# Patient Record
Sex: Male | Born: 1975 | Race: White | Hispanic: No | Marital: Married | Smoking: Never smoker
Health system: Southern US, Community
[De-identification: ages and names within clinical notes are randomized; demographics above are authoritative.]

## PROBLEM LIST (undated history)

## (undated) DIAGNOSIS — E785 Hyperlipidemia, unspecified: Secondary | ICD-10-CM

## (undated) DIAGNOSIS — K429 Umbilical hernia without obstruction or gangrene: Secondary | ICD-10-CM

## (undated) DIAGNOSIS — K219 Gastro-esophageal reflux disease without esophagitis: Secondary | ICD-10-CM

## (undated) DIAGNOSIS — F329 Major depressive disorder, single episode, unspecified: Secondary | ICD-10-CM

## (undated) DIAGNOSIS — F909 Attention-deficit hyperactivity disorder, unspecified type: Secondary | ICD-10-CM

## (undated) DIAGNOSIS — F32A Depression, unspecified: Secondary | ICD-10-CM

## (undated) DIAGNOSIS — J45909 Unspecified asthma, uncomplicated: Secondary | ICD-10-CM

## (undated) DIAGNOSIS — K4090 Unilateral inguinal hernia, without obstruction or gangrene, not specified as recurrent: Secondary | ICD-10-CM

## (undated) HISTORY — DX: Major depressive disorder, single episode, unspecified: F32.9

## (undated) HISTORY — DX: Attention-deficit hyperactivity disorder, unspecified type: F90.9

## (undated) HISTORY — DX: Depression, unspecified: F32.A

## (undated) HISTORY — DX: Hyperlipidemia, unspecified: E78.5

## (undated) HISTORY — DX: Unilateral inguinal hernia, without obstruction or gangrene, not specified as recurrent: K40.90

## (undated) HISTORY — PX: WISDOM TOOTH EXTRACTION: SHX21

## (undated) HISTORY — DX: Unspecified asthma, uncomplicated: J45.909

## (undated) HISTORY — DX: Gastro-esophageal reflux disease without esophagitis: K21.9

---

## 2001-10-24 ENCOUNTER — Ambulatory Visit (HOSPITAL_COMMUNITY): Admission: RE | Admit: 2001-10-24 | Discharge: 2001-10-24 | Payer: Self-pay | Admitting: Family Medicine

## 2001-10-24 ENCOUNTER — Encounter: Payer: Self-pay | Admitting: Family Medicine

## 2011-11-29 ENCOUNTER — Encounter (INDEPENDENT_AMBULATORY_CARE_PROVIDER_SITE_OTHER): Payer: Self-pay | Admitting: Surgery

## 2011-12-11 ENCOUNTER — Encounter (INDEPENDENT_AMBULATORY_CARE_PROVIDER_SITE_OTHER): Payer: Self-pay | Admitting: Surgery

## 2011-12-11 ENCOUNTER — Ambulatory Visit (INDEPENDENT_AMBULATORY_CARE_PROVIDER_SITE_OTHER): Payer: Managed Care, Other (non HMO) | Admitting: Surgery

## 2011-12-11 VITALS — BP 124/68 | HR 66 | Temp 97.9°F | Resp 14 | Ht 71.0 in | Wt 231.5 lb

## 2011-12-11 DIAGNOSIS — K429 Umbilical hernia without obstruction or gangrene: Secondary | ICD-10-CM

## 2011-12-11 DIAGNOSIS — K4020 Bilateral inguinal hernia, without obstruction or gangrene, not specified as recurrent: Secondary | ICD-10-CM | POA: Insufficient documentation

## 2011-12-11 NOTE — Progress Notes (Signed)
Patient ID: Steven Parker, male   DOB: 12-20-1975, 36 y.o.   MRN: [id number]  Chief Complaint  Patient presents with  . Inguinal Hernia    HPI Steven Parker is a 36 y.o. male.  This is a very pleasant gentleman referred by Dr. Nicholos Johns for evaluation of a possible inguinal hernia. The patient recently noticed a bulge in the left groin after having a lot of gas. His son had recently had a hernia repair himself so the patient was more aware of the issue. He has had no obstructive symptoms. The pain is only mild and not refer any where else. HPI  Past Medical History  Diagnosis Date  . GERD (gastroesophageal reflux disease)   . Asthma   . Depression   . Hyperlipidemia   . ADHD (attention deficit hyperactivity disorder)   . Inguinal hernia     Past Surgical History  Procedure Date  . Wisdom tooth extraction     Family History  Problem Relation Age of Onset  . Cancer Paternal Grandmother     pancreatic - 2010    Social History History  Substance Use Topics  . Smoking status: Never Smoker   . Smokeless tobacco: Never Used  . Alcohol Use: Yes     2 drinks per month    Allergies  Allergen Reactions  . Penicillins Shortness Of Breath and Swelling  . Potassium-Containing Compounds     Current Outpatient Prescriptions  Medication Sig Dispense Refill  . amphetamine-dextroamphetamine (ADDERALL XR) 30 MG 24 hr capsule Take 30 mg by mouth every morning.      Marland Kitchen esomeprazole (NEXIUM) 40 MG capsule Take 40 mg by mouth daily before breakfast.      . fish oil-omega-3 fatty acids 1000 MG capsule Take 2 g by mouth daily.      . Multiple Vitamins-Minerals (MULTIVITAMIN WITH MINERALS) tablet Take 1 tablet by mouth daily.        Review of Systems Review of Systems  Constitutional: Negative for fever, chills and unexpected weight change.  HENT: Negative for hearing loss, congestion, sore throat, trouble swallowing and voice change.   Eyes: Negative for visual disturbance.  Respiratory:  Negative for cough and wheezing.   Cardiovascular: Negative for chest pain, palpitations and leg swelling.  Gastrointestinal: Negative for nausea, vomiting, abdominal pain, diarrhea, constipation, blood in stool, abdominal distention, anal bleeding and rectal pain.  Genitourinary: Negative for hematuria and difficulty urinating.  Musculoskeletal: Negative for arthralgias.  Skin: Negative for rash and wound.  Neurological: Negative for seizures, syncope, weakness and headaches.  Hematological: Negative for adenopathy. Does not bruise/bleed easily.  Psychiatric/Behavioral: Negative for confusion.    Blood pressure 124/68, pulse 66, temperature 97.9 F (36.6 C), temperature source Temporal, resp. rate 14, height 5\' 11"  (1.803 m), weight 231 lb 8 oz (105.008 kg).  Physical Exam Physical Exam  Constitutional: He is oriented to person, place, and time. He appears well-developed and well-nourished. No distress.  HENT:  Head: Normocephalic and atraumatic.  Right Ear: External ear normal.  Left Ear: External ear normal.  Nose: Nose normal.  Mouth/Throat: Oropharynx is clear and moist. No oropharyngeal exudate.  Eyes: Conjunctivae and EOM are normal. Pupils are equal, round, and reactive to light. No scleral icterus.  Neck: Normal range of motion. Neck supple. No tracheal deviation present. No thyromegaly present.  Cardiovascular: Normal rate, regular rhythm, normal heart sounds and intact distal pulses.   No murmur heard. Pulmonary/Chest: Effort normal and breath sounds normal. No respiratory distress. He has no  wheezes.  Abdominal: Soft. Bowel sounds are normal. He exhibits no distension. There is no tenderness. There is no rebound.       he actually has bilateral inguinal hernias with the left being larger than the right. Both are reducible. He also has a small chronically incarcerated umbilical hernia  Musculoskeletal: Normal range of motion. He exhibits no edema and no tenderness.    Lymphadenopathy:    He has no cervical adenopathy.  Neurological: He is alert and oriented to person, place, and time.  Skin: Skin is warm and dry. No rash noted. He is not diaphoretic. No erythema.  Psychiatric: His behavior is normal. Judgment normal.    Data Reviewed   Assessment    Bilateral inguinal hernias and an umbilical hernia    Plan    Laparoscopic repair with mesh was recommended. I discussed this with the patient in detail. I discussed the risk of surgery which includes but is not limited to bleeding, infection, recurrence, nerve entrapment, chronic pain, postoperative recovery, et [name]. He understands and wishes to proceed. Likelihood of success is good       [name] A 12/11/2011, 10:23 AM

## 2012-01-07 ENCOUNTER — Encounter (HOSPITAL_COMMUNITY): Payer: Self-pay | Admitting: Pharmacy Technician

## 2012-01-14 ENCOUNTER — Encounter (HOSPITAL_COMMUNITY): Payer: Self-pay

## 2012-01-14 ENCOUNTER — Encounter (HOSPITAL_COMMUNITY)
Admission: RE | Admit: 2012-01-14 | Discharge: 2012-01-14 | Disposition: A | Discharge status: Home / Self Care | Payer: Managed Care, Other (non HMO) | Source: Ambulatory Visit | Attending: Surgery | Admitting: Surgery

## 2012-01-14 DIAGNOSIS — K429 Umbilical hernia without obstruction or gangrene: Secondary | ICD-10-CM

## 2012-01-14 DIAGNOSIS — J45909 Unspecified asthma, uncomplicated: Secondary | ICD-10-CM

## 2012-01-14 DIAGNOSIS — K219 Gastro-esophageal reflux disease without esophagitis: Secondary | ICD-10-CM

## 2012-01-14 HISTORY — DX: Umbilical hernia without obstruction or gangrene: K42.9

## 2012-01-14 HISTORY — DX: Unspecified asthma, uncomplicated: J45.909

## 2012-01-14 HISTORY — DX: Gastro-esophageal reflux disease without esophagitis: K21.9

## 2012-01-14 LAB — SURGICAL PCR SCREEN: Staphylococcus aureus: POSITIVE — AB

## 2012-01-14 NOTE — Pre-Procedure Instructions (Signed)
01-14-12 Pt. Made aware of Positive PCR screen(Staph aureus). Pt will fill Rx. For Mupirocin and use as directed. W. Kennon Portela

## 2012-01-14 NOTE — Patient Instructions (Addendum)
20 Quade Ramirez  01/14/2012   Your procedure is scheduled on:   01-17-2012  Report to [facility] at        0900 AM.  Call this number if you have problems the morning of surgery: [phone]/ [phone] Presurgical Testing   Remember:   Do not eat food:After Midnight.  May have clear liquids:up to 6 Hours before arrival. Nothing after : 0500 Am  Clear liquids include soda, tea, black coffee, apple or grape juice, broth.  Take these medicines the morning of surgery with A SIP OF WATER: none   Do not wear jewelry, make-up or nail polish.  Do not wear lotions, powders, or perfumes. You may wear deodorant.  Do not shave 48 hours prior to surgery.(face and neck okay, no shaving of legs)  Do not bring valuables to the hospital.  Contacts, dentures or bridgework may not be worn into surgery.  Leave suitcase in the car. After surgery it may be brought to your room.  For patients admitted to the hospital, checkout time is 11:00 AM the day of discharge.   Patients discharged the day of surgery will not be allowed to drive home.  Name and phone number of your driver: spouse  Special Instructions: CHG Shower Use Special Wash: 1/2 bottle night before surgery and 1/2 bottle morning of surgery.(avoid face and genitals)   Please read over the following fact sheets that you were given: MRSA Information, Incentive Spirometry Instruction.

## 2012-01-16 NOTE — H&P (Signed)
Patient ID: Steven Parker, male DOB: 02-21-1976, 36 y.o. MRN: [id number]  Chief Complaint   Patient presents with   .  Inguinal Hernia    HPI  Steven Parker is a 36 y.o. male. This is a very pleasant gentleman referred by Dr. Nicholos Johns for evaluation of a possible inguinal hernia. The patient recently noticed a bulge in the left groin after having a lot of gas. His son had recently had a hernia repair himself so the patient was more aware of the issue. He has had no obstructive symptoms. The pain is only mild and not refer any where else.  HPI  Past Medical History   Diagnosis  Date   .  GERD (gastroesophageal reflux disease)    .  Asthma    .  Depression    .  Hyperlipidemia    .  ADHD (attention deficit hyperactivity disorder)    .  Inguinal hernia     Past Surgical History   Procedure  Date   .  Wisdom tooth extraction     Family History   Problem  Relation  Age of Onset   .  Cancer  Paternal Grandmother       pancreatic - 2010    Social History  History   Substance Use Topics   .  Smoking status:  Never Smoker   .  Smokeless tobacco:  Never Used   .  Alcohol Use:  Yes      2 drinks per month    Allergies   Allergen  Reactions   .  Penicillins  Shortness Of Breath and Swelling   .  Potassium-Containing Compounds     Current Outpatient Prescriptions   Medication  Sig  Dispense  Refill   .  amphetamine-dextroamphetamine (ADDERALL XR) 30 MG 24 hr capsule  Take 30 mg by mouth every morning.     Marland Kitchen  esomeprazole (NEXIUM) 40 MG capsule  Take 40 mg by mouth daily before breakfast.     .  fish oil-omega-3 fatty acids 1000 MG capsule  Take 2 g by mouth daily.     .  Multiple Vitamins-Minerals (MULTIVITAMIN WITH MINERALS) tablet  Take 1 tablet by mouth daily.      Review of Systems  Review of Systems  Constitutional: Negative for fever, chills and unexpected weight change.  HENT: Negative for hearing loss, congestion, sore throat, trouble swallowing and voice change.  Eyes:  Negative for visual disturbance.  Respiratory: Negative for cough and wheezing.  Cardiovascular: Negative for chest pain, palpitations and leg swelling.  Gastrointestinal: Negative for nausea, vomiting, abdominal pain, diarrhea, constipation, blood in stool, abdominal distention, anal bleeding and rectal pain.  Genitourinary: Negative for hematuria and difficulty urinating.  Musculoskeletal: Negative for arthralgias.  Skin: Negative for rash and wound.  Neurological: Negative for seizures, syncope, weakness and headaches.  Hematological: Negative for adenopathy. Does not bruise/bleed easily.  Psychiatric/Behavioral: Negative for confusion.   Blood pressure 124/68, pulse 66, temperature 97.9 F (36.6 C), temperature source Temporal, resp. rate 14, height 5\' 11"  (1.803 m), weight 231 lb 8 oz (105.008 kg).  Physical Exam  Physical Exam  Constitutional: He is oriented to person, place, and time. He appears well-developed and well-nourished. No distress.  HENT:  Head: Normocephalic and atraumatic.  Right Ear: External ear normal.  Left Ear: External ear normal.  Nose: Nose normal.  Mouth/Throat: Oropharynx is clear and moist. No oropharyngeal exudate.  Eyes: Conjunctivae and EOM are normal. Pupils are equal, round, and  reactive to light. No scleral icterus.  Neck: Normal range of motion. Neck supple. No tracheal deviation present. No thyromegaly present.  Cardiovascular: Normal rate, regular rhythm, normal heart sounds and intact distal pulses.  No murmur heard.  Pulmonary/Chest: Effort normal and breath sounds normal. No respiratory distress. He has no wheezes.  Abdominal: Soft. Bowel sounds are normal. He exhibits no distension. There is no tenderness. There is no rebound.  he actually has bilateral inguinal hernias with the left being larger than the right. Both are reducible. He also has a small chronically incarcerated umbilical hernia  Musculoskeletal: Normal range of motion. He  exhibits no edema and no tenderness.  Lymphadenopathy:  He has no cervical adenopathy.  Neurological: He is alert and oriented to person, place, and time.  Skin: Skin is warm and dry. No rash noted. He is not diaphoretic. No erythema.  Psychiatric: His behavior is normal. Judgment normal.   Data Reviewed  Assessment   Bilateral inguinal hernias and an umbilical hernia   Plan   Laparoscopic repair with mesh was recommended. I discussed this with the patient in detail. I discussed the risk of surgery which includes but is not limited to bleeding, infection, recurrence, nerve entrapment, chronic pain, postoperative recovery, et [name]. He understands and wishes to proceed. Likelihood of success is good   Loudon Krakow A

## 2012-01-17 ENCOUNTER — Ambulatory Visit (HOSPITAL_COMMUNITY): Payer: Managed Care, Other (non HMO) | Admitting: Certified Registered Nurse Anesthetist

## 2012-01-17 ENCOUNTER — Encounter (HOSPITAL_COMMUNITY): Payer: Self-pay

## 2012-01-17 ENCOUNTER — Ambulatory Visit (HOSPITAL_COMMUNITY)
Admission: RE | Admit: 2012-01-17 | Discharge: 2012-01-17 | Disposition: A | Discharge status: Home / Self Care | Payer: Managed Care, Other (non HMO) | Source: Ambulatory Visit | Attending: Surgery | Admitting: Surgery

## 2012-01-17 ENCOUNTER — Encounter (HOSPITAL_COMMUNITY): Payer: Self-pay | Admitting: Certified Registered Nurse Anesthetist

## 2012-01-17 ENCOUNTER — Encounter (HOSPITAL_COMMUNITY)
Admission: RE | Disposition: A | Discharge status: Home / Self Care | Payer: Self-pay | Source: Ambulatory Visit | Attending: Surgery

## 2012-01-17 DIAGNOSIS — Z79899 Other long term (current) drug therapy: Secondary | ICD-10-CM | POA: Insufficient documentation

## 2012-01-17 DIAGNOSIS — K4020 Bilateral inguinal hernia, without obstruction or gangrene, not specified as recurrent: Secondary | ICD-10-CM

## 2012-01-17 DIAGNOSIS — E785 Hyperlipidemia, unspecified: Secondary | ICD-10-CM | POA: Insufficient documentation

## 2012-01-17 DIAGNOSIS — J45909 Unspecified asthma, uncomplicated: Secondary | ICD-10-CM | POA: Insufficient documentation

## 2012-01-17 DIAGNOSIS — K429 Umbilical hernia without obstruction or gangrene: Secondary | ICD-10-CM | POA: Insufficient documentation

## 2012-01-17 DIAGNOSIS — K219 Gastro-esophageal reflux disease without esophagitis: Secondary | ICD-10-CM | POA: Insufficient documentation

## 2012-01-17 DIAGNOSIS — Z01812 Encounter for preprocedural laboratory examination: Secondary | ICD-10-CM | POA: Insufficient documentation

## 2012-01-17 HISTORY — PX: INGUINAL HERNIA REPAIR: SHX194

## 2012-01-17 HISTORY — PX: UMBILICAL HERNIA REPAIR: SHX196

## 2012-01-17 SURGERY — REPAIR, HERNIA, INGUINAL, LAPAROSCOPIC
Anesthesia: General | Site: Abdomen

## 2012-01-17 MED ORDER — ACETAMINOPHEN 10 MG/ML IV SOLN
INTRAVENOUS | Status: AC
  Filled 2012-01-17: qty 100

## 2012-01-17 MED ORDER — SODIUM CHLORIDE 0.9 % IJ SOLN: 3.0000 mL | Freq: Two times a day (BID) | INTRAMUSCULAR | Status: DC

## 2012-01-17 MED ORDER — MORPHINE SULFATE 10 MG/ML IJ SOLN: 4.0000 mg | INTRAMUSCULAR | Status: DC | PRN

## 2012-01-17 MED ORDER — FENTANYL CITRATE 0.05 MG/ML IJ SOLN
INTRAMUSCULAR | Status: DC | PRN
  Administered 2012-01-17 (×2): 50 ug via INTRAVENOUS

## 2012-01-17 MED ORDER — OXYCODONE HCL 5 MG PO TABS
5.0000 mg | ORAL_TABLET | ORAL | Status: DC | PRN
  Administered 2012-01-17 (×3): 5 mg via ORAL
  Filled 2012-01-17 (×3): qty 1

## 2012-01-17 MED ORDER — GLYCOPYRROLATE 0.2 MG/ML IJ SOLN
INTRAMUSCULAR | Status: DC | PRN
  Administered 2012-01-17: 0.6 mg via INTRAVENOUS

## 2012-01-17 MED ORDER — HYDROMORPHONE HCL PF 1 MG/ML IJ SOLN
0.2500 mg | INTRAMUSCULAR | Status: DC | PRN
  Administered 2012-01-17: 0.5 mg via INTRAVENOUS

## 2012-01-17 MED ORDER — CIPROFLOXACIN IN D5W 400 MG/200ML IV SOLN
400.0000 mg | INTRAVENOUS | Status: AC
  Administered 2012-01-17: 400 mg via INTRAVENOUS

## 2012-01-17 MED ORDER — 0.9 % SODIUM CHLORIDE (POUR BTL) OPTIME
TOPICAL | Status: DC | PRN
  Administered 2012-01-17: 1000 mL

## 2012-01-17 MED ORDER — HYDROCODONE-ACETAMINOPHEN 5-325 MG PO TABS: 1.0000 | ORAL_TABLET | ORAL | Status: AC | PRN

## 2012-01-17 MED ORDER — ACETAMINOPHEN 325 MG PO TABS: 650.0000 mg | ORAL_TABLET | ORAL | Status: DC | PRN

## 2012-01-17 MED ORDER — SODIUM CHLORIDE 0.9 % IV SOLN: 250.0000 mL | INTRAVENOUS | Status: DC | PRN

## 2012-01-17 MED ORDER — ROCURONIUM BROMIDE 100 MG/10ML IV SOLN
INTRAVENOUS | Status: DC | PRN
  Administered 2012-01-17: 10 mg via INTRAVENOUS
  Administered 2012-01-17: 40 mg via INTRAVENOUS

## 2012-01-17 MED ORDER — KETOROLAC TROMETHAMINE 30 MG/ML IJ SOLN
INTRAMUSCULAR | Status: DC | PRN
  Administered 2012-01-17: 30 mg via INTRAVENOUS

## 2012-01-17 MED ORDER — LACTATED RINGERS IV SOLN
INTRAVENOUS | Status: DC
  Administered 2012-01-17: 10:00:00 via INTRAVENOUS

## 2012-01-17 MED ORDER — PROPOFOL 10 MG/ML IV EMUL
INTRAVENOUS | Status: DC | PRN
  Administered 2012-01-17: 150 mg via INTRAVENOUS

## 2012-01-17 MED ORDER — EPHEDRINE SULFATE 50 MG/ML IJ SOLN
INTRAMUSCULAR | Status: DC | PRN
  Administered 2012-01-17: 10 mg via INTRAVENOUS

## 2012-01-17 MED ORDER — ACETAMINOPHEN 10 MG/ML IV SOLN
INTRAVENOUS | Status: DC | PRN
  Administered 2012-01-17: 1000 mg via INTRAVENOUS

## 2012-01-17 MED ORDER — LACTATED RINGERS IV SOLN: INTRAVENOUS | Status: DC

## 2012-01-17 MED ORDER — ONDANSETRON HCL 4 MG/2ML IJ SOLN: 4.0000 mg | Freq: Four times a day (QID) | INTRAMUSCULAR | Status: DC | PRN

## 2012-01-17 MED ORDER — PROMETHAZINE HCL 25 MG/ML IJ SOLN: 6.2500 mg | INTRAMUSCULAR | Status: DC | PRN

## 2012-01-17 MED ORDER — BUPIVACAINE HCL (PF) 0.5 % IJ SOLN
INTRAMUSCULAR | Status: AC
  Filled 2012-01-17: qty 30

## 2012-01-17 MED ORDER — ONDANSETRON HCL 4 MG/2ML IJ SOLN
INTRAMUSCULAR | Status: DC | PRN
  Administered 2012-01-17: 4 mg via INTRAVENOUS

## 2012-01-17 MED ORDER — BUPIVACAINE HCL (PF) 0.5 % IJ SOLN
INTRAMUSCULAR | Status: DC | PRN
  Administered 2012-01-17: 20 mL

## 2012-01-17 MED ORDER — NEOSTIGMINE METHYLSULFATE 1 MG/ML IJ SOLN
INTRAMUSCULAR | Status: DC | PRN
  Administered 2012-01-17: 5 mg via INTRAVENOUS

## 2012-01-17 MED ORDER — HYDROMORPHONE HCL PF 1 MG/ML IJ SOLN
INTRAMUSCULAR | Status: AC
  Filled 2012-01-17: qty 1

## 2012-01-17 MED ORDER — MIDAZOLAM HCL 5 MG/5ML IJ SOLN
INTRAMUSCULAR | Status: DC | PRN
  Administered 2012-01-17: 2 mg via INTRAVENOUS

## 2012-01-17 MED ORDER — CIPROFLOXACIN IN D5W 400 MG/200ML IV SOLN
INTRAVENOUS | Status: AC
Start: 2012-01-17 — End: 2012-01-17
  Filled 2012-01-17: qty 200

## 2012-01-17 MED ORDER — ACETAMINOPHEN 650 MG RE SUPP
650.0000 mg | RECTAL | Status: DC | PRN
  Filled 2012-01-17: qty 1

## 2012-01-17 MED ORDER — SODIUM CHLORIDE 0.9 % IV SOLN: INTRAVENOUS | Status: DC

## 2012-01-17 MED ORDER — MEPERIDINE HCL 50 MG/ML IJ SOLN: 6.2500 mg | INTRAMUSCULAR | Status: DC | PRN

## 2012-01-17 MED ORDER — SODIUM CHLORIDE 0.9 % IJ SOLN: 3.0000 mL | INTRAMUSCULAR | Status: DC | PRN

## 2012-01-17 SURGICAL SUPPLY — 55 items
ADH SKN CLS APL DERMABOND .7 (GAUZE/BANDAGES/DRESSINGS) ×3
APL SKNCLS STERI-STRIP NONHPOA (GAUZE/BANDAGES/DRESSINGS) ×3
BANDAGE ADHESIVE 1X3 (GAUZE/BANDAGES/DRESSINGS) IMPLANT
BENZOIN TINCTURE PRP APPL 2/3 (GAUZE/BANDAGES/DRESSINGS) ×4 IMPLANT
BLADE SURG SZ11 CARB STEEL (BLADE) ×4 IMPLANT
CABLE HIGH FREQUENCY MONO STRZ (ELECTRODE) ×4 IMPLANT
CANISTER SUCTION 2500CC (MISCELLANEOUS) ×4 IMPLANT
CLOTH BEACON ORANGE TIMEOUT ST (SAFETY) ×4 IMPLANT
CLSR STERI-STRIP ANTIMIC 1/2X4 (GAUZE/BANDAGES/DRESSINGS) ×2 IMPLANT
DECANTER SPIKE VIAL GLASS SM (MISCELLANEOUS) ×4 IMPLANT
DERMABOND ADVANCED (GAUZE/BANDAGES/DRESSINGS) ×1
DERMABOND ADVANCED .7 DNX12 (GAUZE/BANDAGES/DRESSINGS) ×3 IMPLANT
DEVICE SECURE STRAP 25 ABSORB (INSTRUMENTS) ×4 IMPLANT
DISSECT BALLN SPACEMKR + OVL (BALLOONS) ×4
DISSECTOR BALLN SPACEMKR + OVL (BALLOONS) ×3 IMPLANT
DISSECTOR BLUNT TIP ENDO 5MM (MISCELLANEOUS) IMPLANT
DRAPE INCISE IOBAN 66X45 STRL (DRAPES) ×4 IMPLANT
DRAPE LAPAROSCOPIC ABDOMINAL (DRAPES) ×4 IMPLANT
ELECT REM PT RETURN 9FT ADLT (ELECTROSURGICAL) ×4
ELECTRODE REM PT RTRN 9FT ADLT (ELECTROSURGICAL) ×3 IMPLANT
GLOVE BIO SURGEON STRL SZ7 (GLOVE) ×8 IMPLANT
GLOVE BIOGEL PI IND STRL 7.0 (GLOVE) ×3 IMPLANT
GLOVE BIOGEL PI IND STRL 7.5 (GLOVE) ×6 IMPLANT
GLOVE BIOGEL PI INDICATOR 7.0 (GLOVE) ×1
GLOVE BIOGEL PI INDICATOR 7.5 (GLOVE) ×2
GLOVE ECLIPSE 8.0 STRL XLNG CF (GLOVE) ×4 IMPLANT
GLOVE SURG SIGNA 7.5 PF LTX (GLOVE) ×8 IMPLANT
GOWN PREVENTION PLUS LG XLONG (DISPOSABLE) ×4 IMPLANT
GOWN PREVENTION PLUS XLARGE (GOWN DISPOSABLE) ×4 IMPLANT
GOWN STRL NON-REIN LRG LVL3 (GOWN DISPOSABLE) ×4 IMPLANT
GOWN STRL REIN XL XLG (GOWN DISPOSABLE) ×8 IMPLANT
KIT BASIN OR (CUSTOM PROCEDURE TRAY) ×4 IMPLANT
MESH 3DMAX 4X6 LT LRG (Mesh General) ×2 IMPLANT
MESH 3DMAX 4X6 RT LRG (Mesh General) ×2 IMPLANT
NDL INSUFFLATION 14GA 120MM (NEEDLE) IMPLANT
NEEDLE INSUFFLATION 14GA 120MM (NEEDLE) IMPLANT
NS IRRIG 1000ML POUR BTL (IV SOLUTION) ×4 IMPLANT
PEN SKIN MARKING BROAD (MISCELLANEOUS) ×4 IMPLANT
SCISSORS LAP 5X35 DISP (ENDOMECHANICALS) ×4 IMPLANT
SET IRRIG TUBING LAPAROSCOPIC (IRRIGATION / IRRIGATOR) IMPLANT
SOLUTION ANTI FOG 6CC (MISCELLANEOUS) ×4 IMPLANT
STAPLER VISISTAT 35W (STAPLE) ×4 IMPLANT
STRIP CLOSURE SKIN 1/2X4 (GAUZE/BANDAGES/DRESSINGS) ×4 IMPLANT
SUT MNCRL AB 4-0 PS2 18 (SUTURE) ×4 IMPLANT
SUT PROLENE 0 CT 1 CR/8 (SUTURE) IMPLANT
SUT PROLENE 2 0 SH DA (SUTURE) IMPLANT
TACKER 5MM HERNIA 3.5CML NAB (ENDOMECHANICALS) ×2 IMPLANT
TOWEL OR 17X26 10 PK STRL BLUE (TOWEL DISPOSABLE) ×4 IMPLANT
TRAY FOLEY CATH 14FRSI W/METER (CATHETERS) ×4 IMPLANT
TRAY LAP CHOLE (CUSTOM PROCEDURE TRAY) ×4 IMPLANT
TROCAR BALLN 12MMX100 BLUNT (TROCAR) ×2 IMPLANT
TROCAR BLADELESS OPT 5 75 (ENDOMECHANICALS) ×4 IMPLANT
TROCAR CANNULA W/PORT DUAL 5MM (MISCELLANEOUS) ×4 IMPLANT
TROCAR XCEL NON-BLD 11X100MML (ENDOMECHANICALS) ×2 IMPLANT
TUBING INSUFFLATION 10FT LAP (TUBING) ×4 IMPLANT

## 2012-01-17 NOTE — Transfer of Care (Signed)
Immediate Anesthesia Transfer of Care Note  Patient: Steven Parker  Procedure(s) Performed: Procedure(s) (LRB): LAPAROSCOPIC INGUINAL HERNIA (Bilateral) INSERTION OF MESH (N/A) HERNIA REPAIR UMBILICAL ADULT (N/A)  Patient Location: PACU  Anesthesia Type: General  Level of Consciousness: sedated, patient cooperative and responds to stimulaton  Airway & Oxygen Therapy: Patient Spontanous Breathing and Patient connected to face mask oxgen  Post-op Assessment: Report given to PACU RN and Post -op Vital signs reviewed and stable  Post vital signs: Reviewed and stable  Complications: No apparent anesthesia complications

## 2012-01-17 NOTE — Op Note (Signed)
LAPAROSCOPIC INGUINAL HERNIA, INSERTION OF MESH, HERNIA REPAIR UMBILICAL ADULT  Procedure Note  Steven Parker 01/17/2012   Pre-op Diagnosis: bilateral inguinal hernia, umbilical hernia     Post-op Diagnosis: same  Procedure(s): LAPAROSCOPIC INGUINAL HERNIA INSERTION OF MESH HERNIA REPAIR UMBILICAL ADULT  Surgeon(s): Shelly Rubenstein, MD  Anesthesia: General  Staff:  Gerda Diss, RN - Circulator Guadelupe Sabin, CST - Scrub Person April C McKinney, CST - Scrub Person  Estimated Blood Loss: Minimal               Procedure: The patient was brought to the operating room and identified as the correct patient. He was placed supine on the operating room table and general anesthesia was induced. His abdomen was then prepped and draped in the usual sterile fashion. I made Parker small incision just below the umbilicus. I did this down to the fascia which was opened with Parker scalpel. The rectus muscle was identified and elevated. I put Parker dissecting balloon down underneath the rectus muscle and manipulated to the pubis. I then insufflated Parker dissecting balloon under direct vision dissecting out the preperitoneal space.  I then removed the dissecting balloon and insufflated the space with CO2. I then placed 25 mm ports in the patient's lower midline under direct vision. First dissect out the right inguinal area. The patient had Parker large indirect hernia sac which as it was reduced from the cord structures. I easily identify Cooper's ligament. There was no evidence of direct hernia. I then turned my attention to the left inguinal area. Again the patient had Parker large indirect hernia sac the left side. I was able to reduce this from the cord structures as well. There was also no sign of Parker direct hernia on the right or left. I then brought to separate pieces of Bard 3-D mesh onto the field. I placed Parker left mesh to the trocar and opened up as it only of left 4. Then using absorbable tacker to tack it to the  Cooper's ligament the medial abdominal wall and out slightly laterally. I then brought the right-sided piece of mesh onto the field and placed into the port at the umbilicus and again opened as it only on the right inguinal floor. I again used the absorbable tacker to tack it to the medial abdominal wall Cooper's ligament and slightly laterally. Good coverage of both inguinal floors appeared be achieved. Hemostasis also achieved. All ports were removed under direct vision and the preperitoneal space appeared to collapse appropriately leaving the mesh in place. I then removed the port at the umbilicus.  I then excised Parker small umbilical hernia sac. I closed the very tiny fascial defect with Parker figure-of-eight 0 Vicryl suture. I then closed the fascial incision with 0 Vicryl suture as well. I anesthetized all the incisions with Marcaine. I then performed an ilioinguinal nerve block bilaterally Marcaine as well. I then closed all incisions with 4-0 Monocryl subcuticular sutures. Steri-Strips and Band-Aids were applied. The patient tolerated the procedure well. All the counts were correct at the end of the procedure. The patient was then extubated in the operating room and taken in stable condition to the recovery room.          Steven Parker   Date: 01/17/2012  Time: 11:45 AM

## 2012-01-17 NOTE — Progress Notes (Signed)
Up to BR with help and walked in hall. Unable to void  Back to bed

## 2012-01-17 NOTE — Anesthesia Preprocedure Evaluation (Signed)

## 2012-01-17 NOTE — Interval H&P Note (Signed)
History and Physical Interval Note:  01/17/2012 9:17 AM  Steven Parker  has presented today for surgery, with the diagnosis of bilateral inguinal hernia, umbilical hernia  The various methods of treatment have been discussed with the patient and family. After consideration of risks, benefits and other options for treatment, the patient has consented to  Procedure(s) (LRB): LAPAROSCOPIC INGUINAL HERNIA (Bilateral) LAPAROSCOPIC UMBILICAL HERNIA (N/A) INSERTION OF MESH (N/A) as a surgical intervention .  The patient's history has been reviewed, patient examined, no change in status, stable for surgery.  I have reviewed the patient's chart and labs.  Questions were answered to the patient's satisfaction.     Tinsley Lomas A

## 2012-01-17 NOTE — Anesthesia Postprocedure Evaluation (Signed)
  Anesthesia Post-op Note  Patient: Steven Parker  Procedure(s) Performed: Procedure(s) (LRB): LAPAROSCOPIC INGUINAL HERNIA (Bilateral) INSERTION OF MESH (N/A) HERNIA REPAIR UMBILICAL ADULT (N/A)  Patient Location: PACU  Anesthesia Type: General  Level of Consciousness: awake and alert   Airway and Oxygen Therapy: Patient Spontanous Breathing  Post-op Pain: mild  Post-op Assessment: Post-op Vital signs reviewed, Patient's Cardiovascular Status Stable, Respiratory Function Stable, Patent Airway and No signs of Nausea or vomiting  Post-op Vital Signs: stable  Complications: No apparent anesthesia complications

## 2012-01-18 ENCOUNTER — Encounter (HOSPITAL_COMMUNITY): Payer: Self-pay | Admitting: Surgery

## 2012-02-12 ENCOUNTER — Encounter (INDEPENDENT_AMBULATORY_CARE_PROVIDER_SITE_OTHER): Payer: Managed Care, Other (non HMO) | Admitting: Surgery

## 2012-02-15 ENCOUNTER — Encounter (INDEPENDENT_AMBULATORY_CARE_PROVIDER_SITE_OTHER): Payer: Managed Care, Other (non HMO) | Admitting: Surgery

## 2012-02-18 ENCOUNTER — Telehealth (INDEPENDENT_AMBULATORY_CARE_PROVIDER_SITE_OTHER): Payer: Self-pay | Admitting: General Surgery

## 2012-02-18 NOTE — Telephone Encounter (Signed)
Pt called to report he is leaving for extended stay in [place] (5-6 months) and had question about his incision.  He will not be available to come in for a post op check, as he is leaving day after tomorrow.  Pt denies pain, fever or any problems with bowel movements.  Instructed pt that sutures are dissolvable, so do not need to be removed in the office.

## 2015-12-06 DIAGNOSIS — J069 Acute upper respiratory infection, unspecified: Secondary | ICD-10-CM | POA: Diagnosis not present

## 2016-01-03 DIAGNOSIS — R05 Cough: Secondary | ICD-10-CM | POA: Diagnosis not present

## 2016-01-03 DIAGNOSIS — J189 Pneumonia, unspecified organism: Secondary | ICD-10-CM | POA: Diagnosis not present

## 2016-01-28 DIAGNOSIS — R05 Cough: Secondary | ICD-10-CM | POA: Diagnosis not present

## 2016-01-28 DIAGNOSIS — J029 Acute pharyngitis, unspecified: Secondary | ICD-10-CM | POA: Diagnosis not present

## 2016-08-21 DIAGNOSIS — Z6837 Body mass index (BMI) 37.0-37.9, adult: Secondary | ICD-10-CM | POA: Diagnosis not present

## 2016-08-21 DIAGNOSIS — Z13220 Encounter for screening for lipoid disorders: Secondary | ICD-10-CM | POA: Diagnosis not present

## 2016-08-21 DIAGNOSIS — Z713 Dietary counseling and surveillance: Secondary | ICD-10-CM | POA: Diagnosis not present

## 2016-08-21 DIAGNOSIS — Z136 Encounter for screening for cardiovascular disorders: Secondary | ICD-10-CM | POA: Diagnosis not present

## 2016-12-30 DIAGNOSIS — J069 Acute upper respiratory infection, unspecified: Secondary | ICD-10-CM | POA: Diagnosis not present

## 2016-12-30 DIAGNOSIS — R05 Cough: Secondary | ICD-10-CM | POA: Diagnosis not present

## 2017-01-26 DIAGNOSIS — R1033 Periumbilical pain: Secondary | ICD-10-CM | POA: Diagnosis not present

## 2017-01-29 ENCOUNTER — Other Ambulatory Visit: Payer: Self-pay | Admitting: Family Medicine

## 2017-01-29 DIAGNOSIS — R1033 Periumbilical pain: Secondary | ICD-10-CM

## 2017-02-01 ENCOUNTER — Ambulatory Visit
Admission: RE | Admit: 2017-02-01 | Discharge: 2017-02-01 | Disposition: A | Discharge status: Home / Self Care | Payer: BLUE CROSS/BLUE SHIELD | Source: Ambulatory Visit | Attending: Family Medicine | Admitting: Family Medicine

## 2017-02-01 DIAGNOSIS — R1033 Periumbilical pain: Secondary | ICD-10-CM

## 2017-06-27 DIAGNOSIS — N529 Male erectile dysfunction, unspecified: Secondary | ICD-10-CM | POA: Diagnosis not present

## 2017-06-27 DIAGNOSIS — K219 Gastro-esophageal reflux disease without esophagitis: Secondary | ICD-10-CM | POA: Diagnosis not present

## 2017-07-30 DIAGNOSIS — M25561 Pain in right knee: Secondary | ICD-10-CM | POA: Diagnosis not present

## 2017-07-30 DIAGNOSIS — M7052 Other bursitis of knee, left knee: Secondary | ICD-10-CM | POA: Diagnosis not present

## 2017-08-23 DIAGNOSIS — Z713 Dietary counseling and surveillance: Secondary | ICD-10-CM | POA: Diagnosis not present

## 2017-08-23 DIAGNOSIS — Z136 Encounter for screening for cardiovascular disorders: Secondary | ICD-10-CM | POA: Diagnosis not present

## 2017-08-23 DIAGNOSIS — Z13220 Encounter for screening for lipoid disorders: Secondary | ICD-10-CM | POA: Diagnosis not present

## 2017-08-23 DIAGNOSIS — Z23 Encounter for immunization: Secondary | ICD-10-CM | POA: Diagnosis not present

## 2017-10-31 DIAGNOSIS — F4322 Adjustment disorder with anxiety: Secondary | ICD-10-CM | POA: Diagnosis not present

## 2017-11-21 DIAGNOSIS — F4322 Adjustment disorder with anxiety: Secondary | ICD-10-CM | POA: Diagnosis not present

## 2017-12-05 DIAGNOSIS — F4322 Adjustment disorder with anxiety: Secondary | ICD-10-CM | POA: Diagnosis not present

## 2018-01-16 DIAGNOSIS — F4322 Adjustment disorder with anxiety: Secondary | ICD-10-CM | POA: Diagnosis not present

## 2018-01-30 DIAGNOSIS — F4322 Adjustment disorder with anxiety: Secondary | ICD-10-CM | POA: Diagnosis not present

## 2018-02-13 DIAGNOSIS — F4322 Adjustment disorder with anxiety: Secondary | ICD-10-CM | POA: Diagnosis not present

## 2018-02-27 DIAGNOSIS — F4322 Adjustment disorder with anxiety: Secondary | ICD-10-CM | POA: Diagnosis not present

## 2018-04-03 DIAGNOSIS — F4322 Adjustment disorder with anxiety: Secondary | ICD-10-CM | POA: Diagnosis not present

## 2018-05-01 DIAGNOSIS — F4322 Adjustment disorder with anxiety: Secondary | ICD-10-CM | POA: Diagnosis not present

## 2018-06-10 IMAGING — US US ABDOMEN LIMITED
1 series · 12 of 12 positions shown · non-contrast
Comparison: None.

CLINICAL DATA: History of umbilical hernia repair 6 years ago.
Patient presents with 2 weeks of right lower periumbilical pain.

EXAM:
ULTRASOUND ABDOMEN LIMITED

[Series 1: us abdomen limited · 0.08mm/px · 12 acquisitions, 12 frames shown]
[im 1/12]
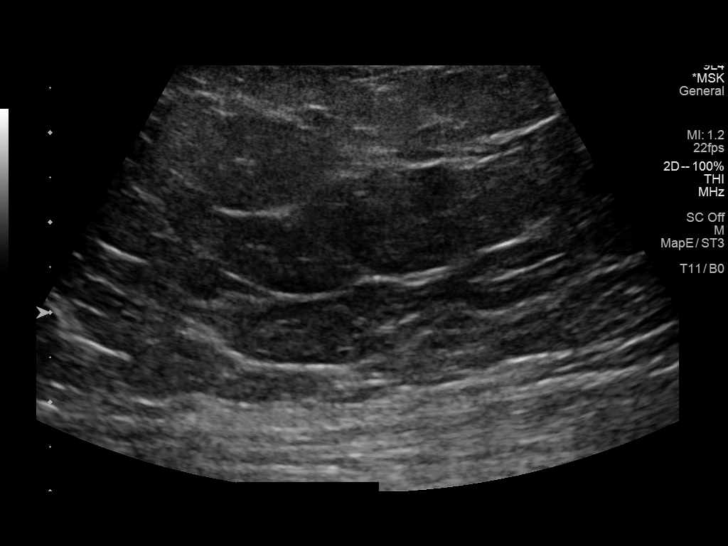
[im 2/12]
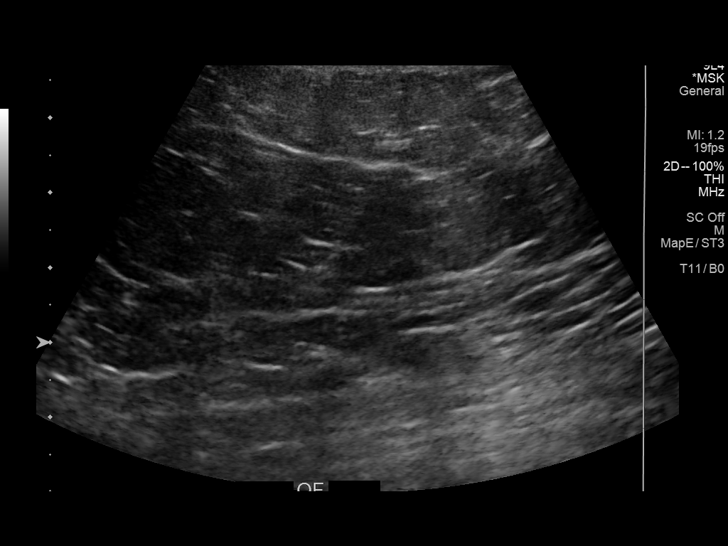
[im 3/12]
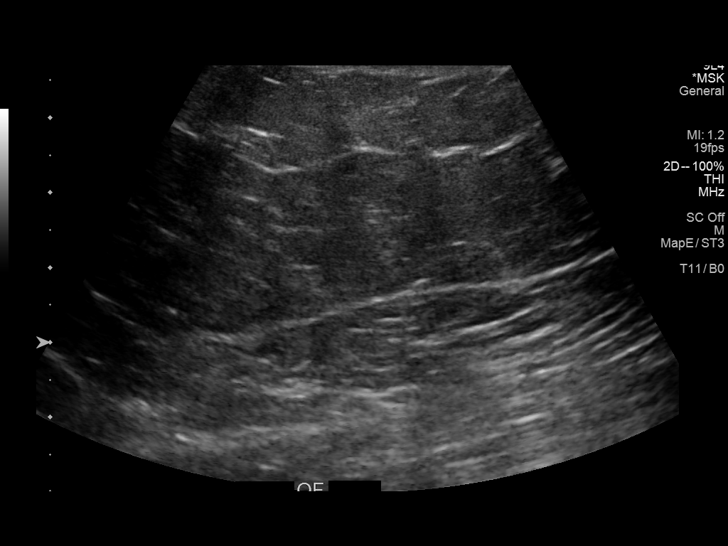
[im 4/12]
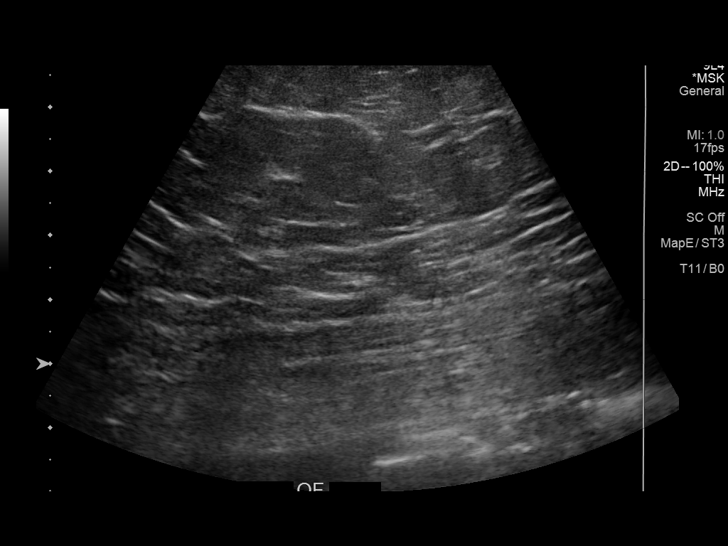
[im 5/12]
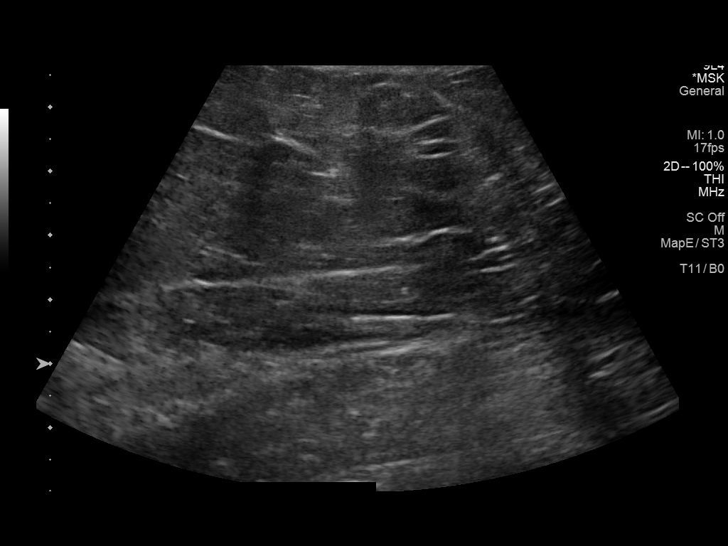
[im 6/12]
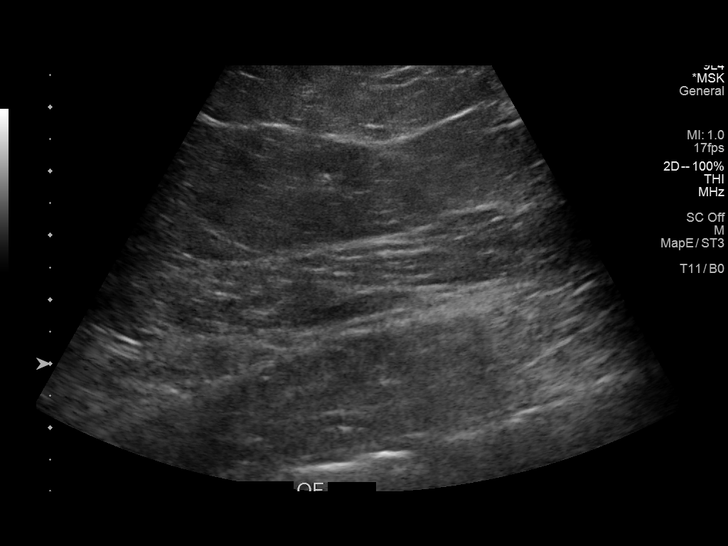
[im 7/12]
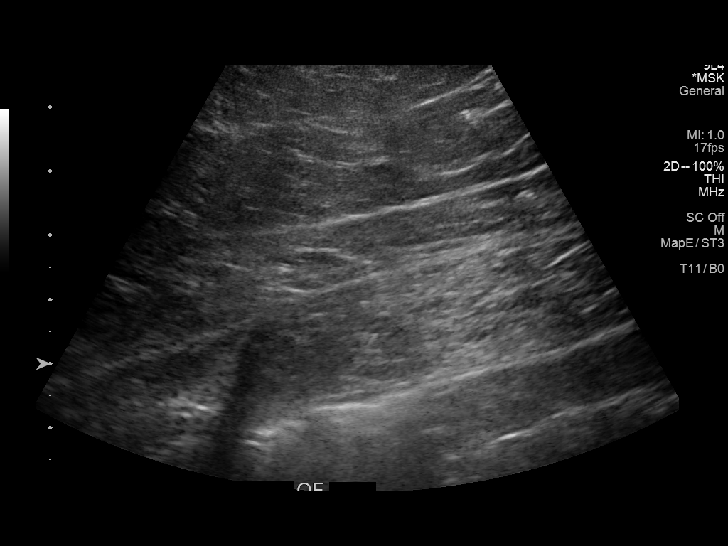
[im 8/12]
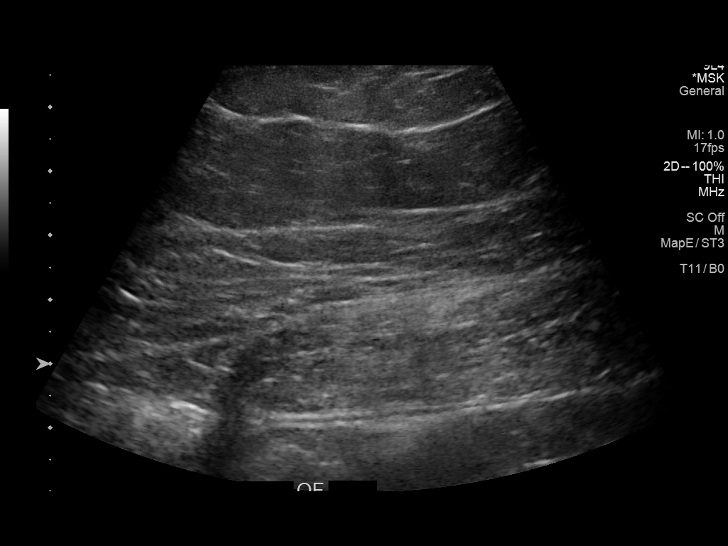
[im 9/12]
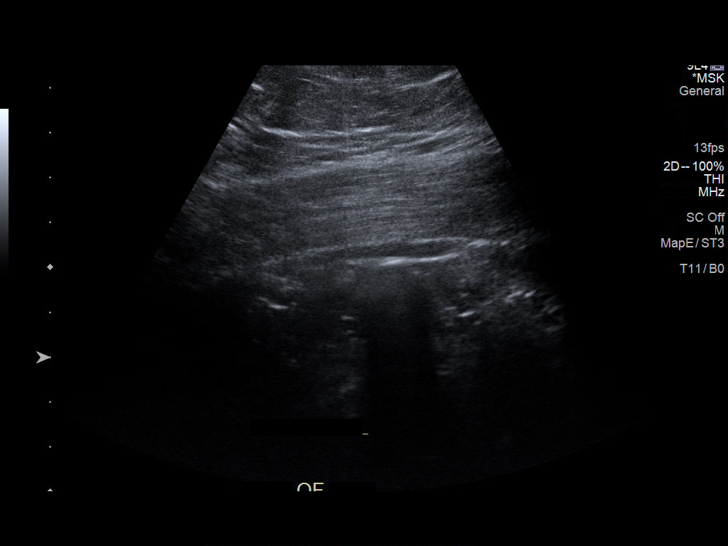
[im 10/12]
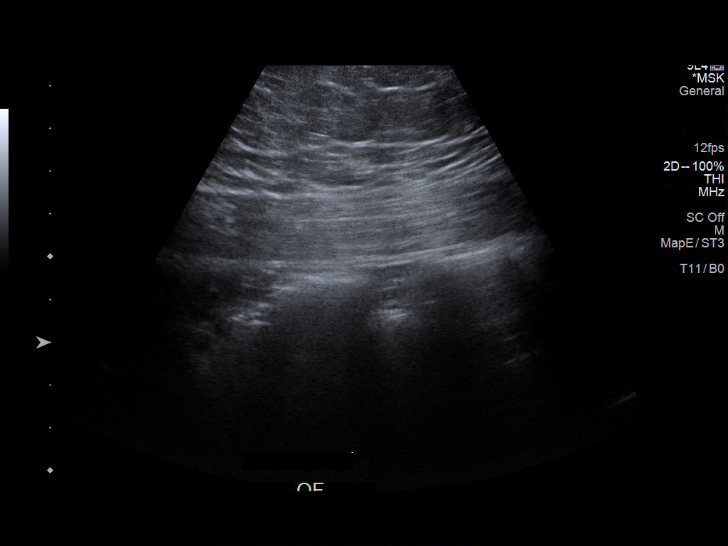
[im 11/12]
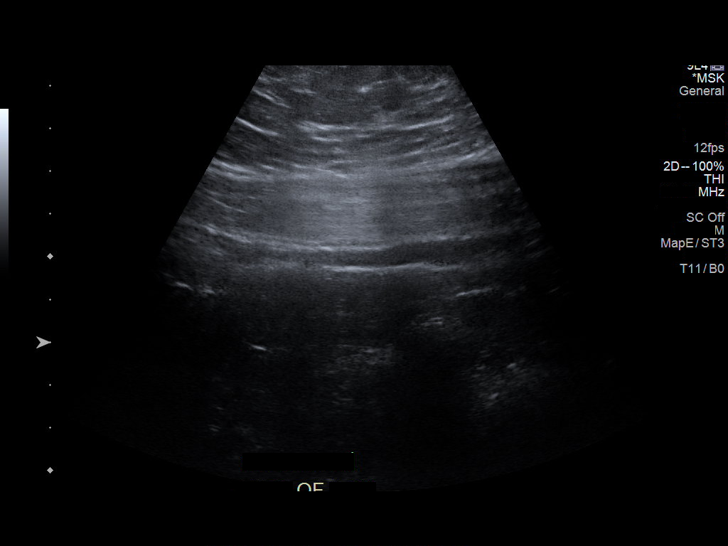
[im 12/12]
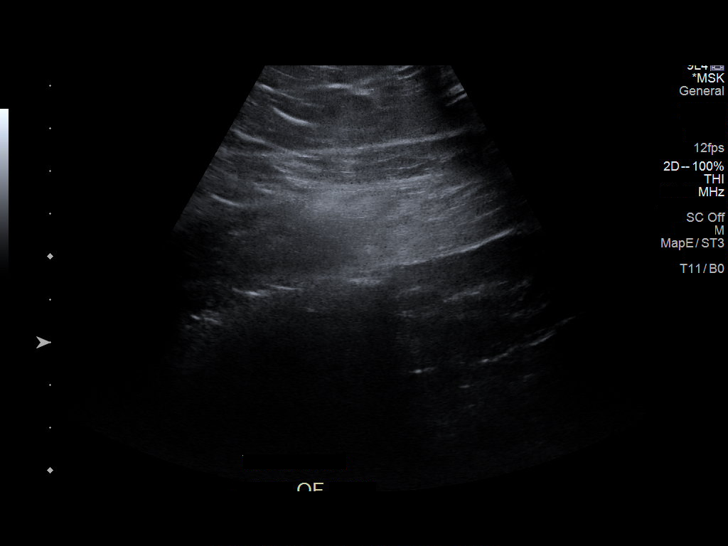

[12 of 12 positions shown; findings below may reference images not displayed]

FINDINGS: No mass, fluid collection or ventral abdominal hernia is
demonstrated at the area of symptomatic concern as indicated by the
patient in the right lower periumbilical region.
IMPRESSION: No evidence of mass, fluid collection or ventral abdominal hernia at
the area of symptomatic concern as indicated by the patient in the
right lower periumbilical region.

## 2018-08-13 DIAGNOSIS — K219 Gastro-esophageal reflux disease without esophagitis: Secondary | ICD-10-CM | POA: Diagnosis not present

## 2018-08-23 DIAGNOSIS — Z713 Dietary counseling and surveillance: Secondary | ICD-10-CM | POA: Diagnosis not present

## 2018-08-23 DIAGNOSIS — Z13220 Encounter for screening for lipoid disorders: Secondary | ICD-10-CM | POA: Diagnosis not present

## 2018-08-23 DIAGNOSIS — Z136 Encounter for screening for cardiovascular disorders: Secondary | ICD-10-CM | POA: Diagnosis not present

## 2018-08-23 DIAGNOSIS — E7800 Pure hypercholesterolemia, unspecified: Secondary | ICD-10-CM | POA: Diagnosis not present

## 2018-12-23 DIAGNOSIS — J029 Acute pharyngitis, unspecified: Secondary | ICD-10-CM | POA: Diagnosis not present

## 2019-02-12 DIAGNOSIS — Z0000 Encounter for general adult medical examination without abnormal findings: Secondary | ICD-10-CM | POA: Diagnosis not present

## 2019-03-04 DIAGNOSIS — K219 Gastro-esophageal reflux disease without esophagitis: Secondary | ICD-10-CM | POA: Diagnosis not present

## 2019-03-10 DIAGNOSIS — Z0000 Encounter for general adult medical examination without abnormal findings: Secondary | ICD-10-CM | POA: Diagnosis not present

## 2019-03-10 DIAGNOSIS — Z13220 Encounter for screening for lipoid disorders: Secondary | ICD-10-CM | POA: Diagnosis not present

## 2019-04-09 DIAGNOSIS — Z1159 Encounter for screening for other viral diseases: Secondary | ICD-10-CM | POA: Diagnosis not present

## 2019-04-14 DIAGNOSIS — K228 Other specified diseases of esophagus: Secondary | ICD-10-CM | POA: Diagnosis not present

## 2019-04-14 DIAGNOSIS — K219 Gastro-esophageal reflux disease without esophagitis: Secondary | ICD-10-CM | POA: Diagnosis not present

## 2019-04-14 DIAGNOSIS — R12 Heartburn: Secondary | ICD-10-CM | POA: Diagnosis not present

## 2019-04-14 DIAGNOSIS — K3189 Other diseases of stomach and duodenum: Secondary | ICD-10-CM | POA: Diagnosis not present

## 2019-04-14 DIAGNOSIS — K2930 Chronic superficial gastritis without bleeding: Secondary | ICD-10-CM | POA: Diagnosis not present

## 2019-04-17 ENCOUNTER — Other Ambulatory Visit: Payer: Self-pay | Admitting: Physician Assistant

## 2019-04-17 ENCOUNTER — Ambulatory Visit
Admission: RE | Admit: 2019-04-17 | Discharge: 2019-04-17 | Disposition: A | Discharge status: Home / Self Care | Payer: BC Managed Care – PPO | Source: Ambulatory Visit | Attending: Physician Assistant | Admitting: Physician Assistant

## 2019-04-17 DIAGNOSIS — R0781 Pleurodynia: Secondary | ICD-10-CM

## 2019-04-17 DIAGNOSIS — R079 Chest pain, unspecified: Secondary | ICD-10-CM | POA: Diagnosis not present

## 2020-01-07 DIAGNOSIS — H66002 Acute suppurative otitis media without spontaneous rupture of ear drum, left ear: Secondary | ICD-10-CM | POA: Diagnosis not present

## 2020-01-26 DIAGNOSIS — R208 Other disturbances of skin sensation: Secondary | ICD-10-CM | POA: Diagnosis not present

## 2020-01-26 DIAGNOSIS — Z23 Encounter for immunization: Secondary | ICD-10-CM | POA: Diagnosis not present

## 2020-02-04 DIAGNOSIS — M25562 Pain in left knee: Secondary | ICD-10-CM | POA: Diagnosis not present

## 2020-02-09 DIAGNOSIS — R42 Dizziness and giddiness: Secondary | ICD-10-CM | POA: Diagnosis not present

## 2020-02-09 DIAGNOSIS — H903 Sensorineural hearing loss, bilateral: Secondary | ICD-10-CM | POA: Diagnosis not present

## 2020-02-16 DIAGNOSIS — R599 Enlarged lymph nodes, unspecified: Secondary | ICD-10-CM | POA: Diagnosis not present

## 2020-02-16 DIAGNOSIS — G5600 Carpal tunnel syndrome, unspecified upper limb: Secondary | ICD-10-CM | POA: Diagnosis not present

## 2020-02-20 DIAGNOSIS — M25562 Pain in left knee: Secondary | ICD-10-CM | POA: Diagnosis not present

## 2020-02-26 DIAGNOSIS — M25562 Pain in left knee: Secondary | ICD-10-CM | POA: Diagnosis not present

## 2020-05-05 DIAGNOSIS — Z20822 Contact with and (suspected) exposure to covid-19: Secondary | ICD-10-CM | POA: Diagnosis not present

## 2020-05-07 DIAGNOSIS — Z20822 Contact with and (suspected) exposure to covid-19: Secondary | ICD-10-CM | POA: Diagnosis not present

## 2020-06-29 ENCOUNTER — Other Ambulatory Visit: Payer: Self-pay

## 2020-06-29 ENCOUNTER — Ambulatory Visit (INDEPENDENT_AMBULATORY_CARE_PROVIDER_SITE_OTHER): Payer: BC Managed Care – PPO

## 2020-06-29 ENCOUNTER — Ambulatory Visit (INDEPENDENT_AMBULATORY_CARE_PROVIDER_SITE_OTHER): Payer: BC Managed Care – PPO | Admitting: Podiatry

## 2020-06-29 DIAGNOSIS — M7661 Achilles tendinitis, right leg: Secondary | ICD-10-CM | POA: Diagnosis not present

## 2020-06-29 DIAGNOSIS — M7750 Other enthesopathy of unspecified foot: Secondary | ICD-10-CM

## 2020-06-29 DIAGNOSIS — M7751 Other enthesopathy of right foot: Secondary | ICD-10-CM | POA: Diagnosis not present

## 2020-06-29 MED ORDER — METHYLPREDNISOLONE 4 MG PO TBPK: ORAL_TABLET | ORAL | 0 refills | Status: AC

## 2020-06-29 MED ORDER — MELOXICAM 15 MG PO TABS: 15.0000 mg | ORAL_TABLET | Freq: Every day | ORAL | 1 refills | Status: AC

## 2020-07-17 NOTE — Progress Notes (Signed)
   HPI: 45 y.o. male presenting today for evaluation of pain to the posterior aspect of the right heel that is been going on for approximately 2 weeks now.  Patient denies a history of injury.  He has not done anything for treatment.    Past Medical History:  Diagnosis Date  . ADHD (attention deficit hyperactivity disorder)   . Asthma 01-14-12   childhood, no problems now  . Depression   . GERD (gastroesophageal reflux disease) 01-14-12   tx. TUMS or OTC Med if needed  . Hyperlipidemia   . Inguinal hernia   . Umbilical hernia 01-14-12   surgery planned      Physical Exam: General: The patient is alert and oriented x3 in no acute distress.  Dermatology: Skin is warm, dry and supple bilateral lower extremities. Negative for open lesions or macerations.  Vascular: Palpable pedal pulses bilaterally. No edema or erythema noted. Capillary refill within normal limits.  Neurological: Epicritic and protective threshold grossly intact bilaterally.   Musculoskeletal Exam: Pain on palpation noted to the posterior tubercle of the right calcaneus at the insertion of the Achilles tendon consistent with retrocalcaneal bursitis. Range of motion within normal limits. Muscle strength 5/5 in all muscle groups bilateral lower extremities.  Radiographic Exam:  Posterior calcaneal spur noted to the respective calcaneus on lateral view. No fracture or dislocation noted. Normal osseous mineralization noted.     Assessment: 1. Insertional Achilles tendinitis right 2. Retrocalcaneal bursitis   Plan of Care:  1. Patient was evaluated. Radiographs were reviewed today. 2. prescription for Medrol Dosepak 3.  Prescription for meloxicam 15 mg daily after completion of the Dosepak 4.  Ankle brace dispensed.  Wear daily 5.  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 Felecia Shelling, DPM    [address]                                         [place], [place] [address]                Office [phone]  Fax [phone]

## 2020-07-27 ENCOUNTER — Ambulatory Visit: Payer: BC Managed Care – PPO | Admitting: Podiatry

## 2020-08-16 DIAGNOSIS — M25562 Pain in left knee: Secondary | ICD-10-CM | POA: Diagnosis not present

## 2020-08-23 IMAGING — DX DG CHEST 2V
2 series · 2 of 2 positions shown · non-contrast
Comparison: 09/27/2011

CLINICAL DATA: Right-sided pleuritic chest pain 1.5 months. Asthma.

EXAM:
CHEST - 2 VIEW

[dg chest 2 view (1 of 2)]
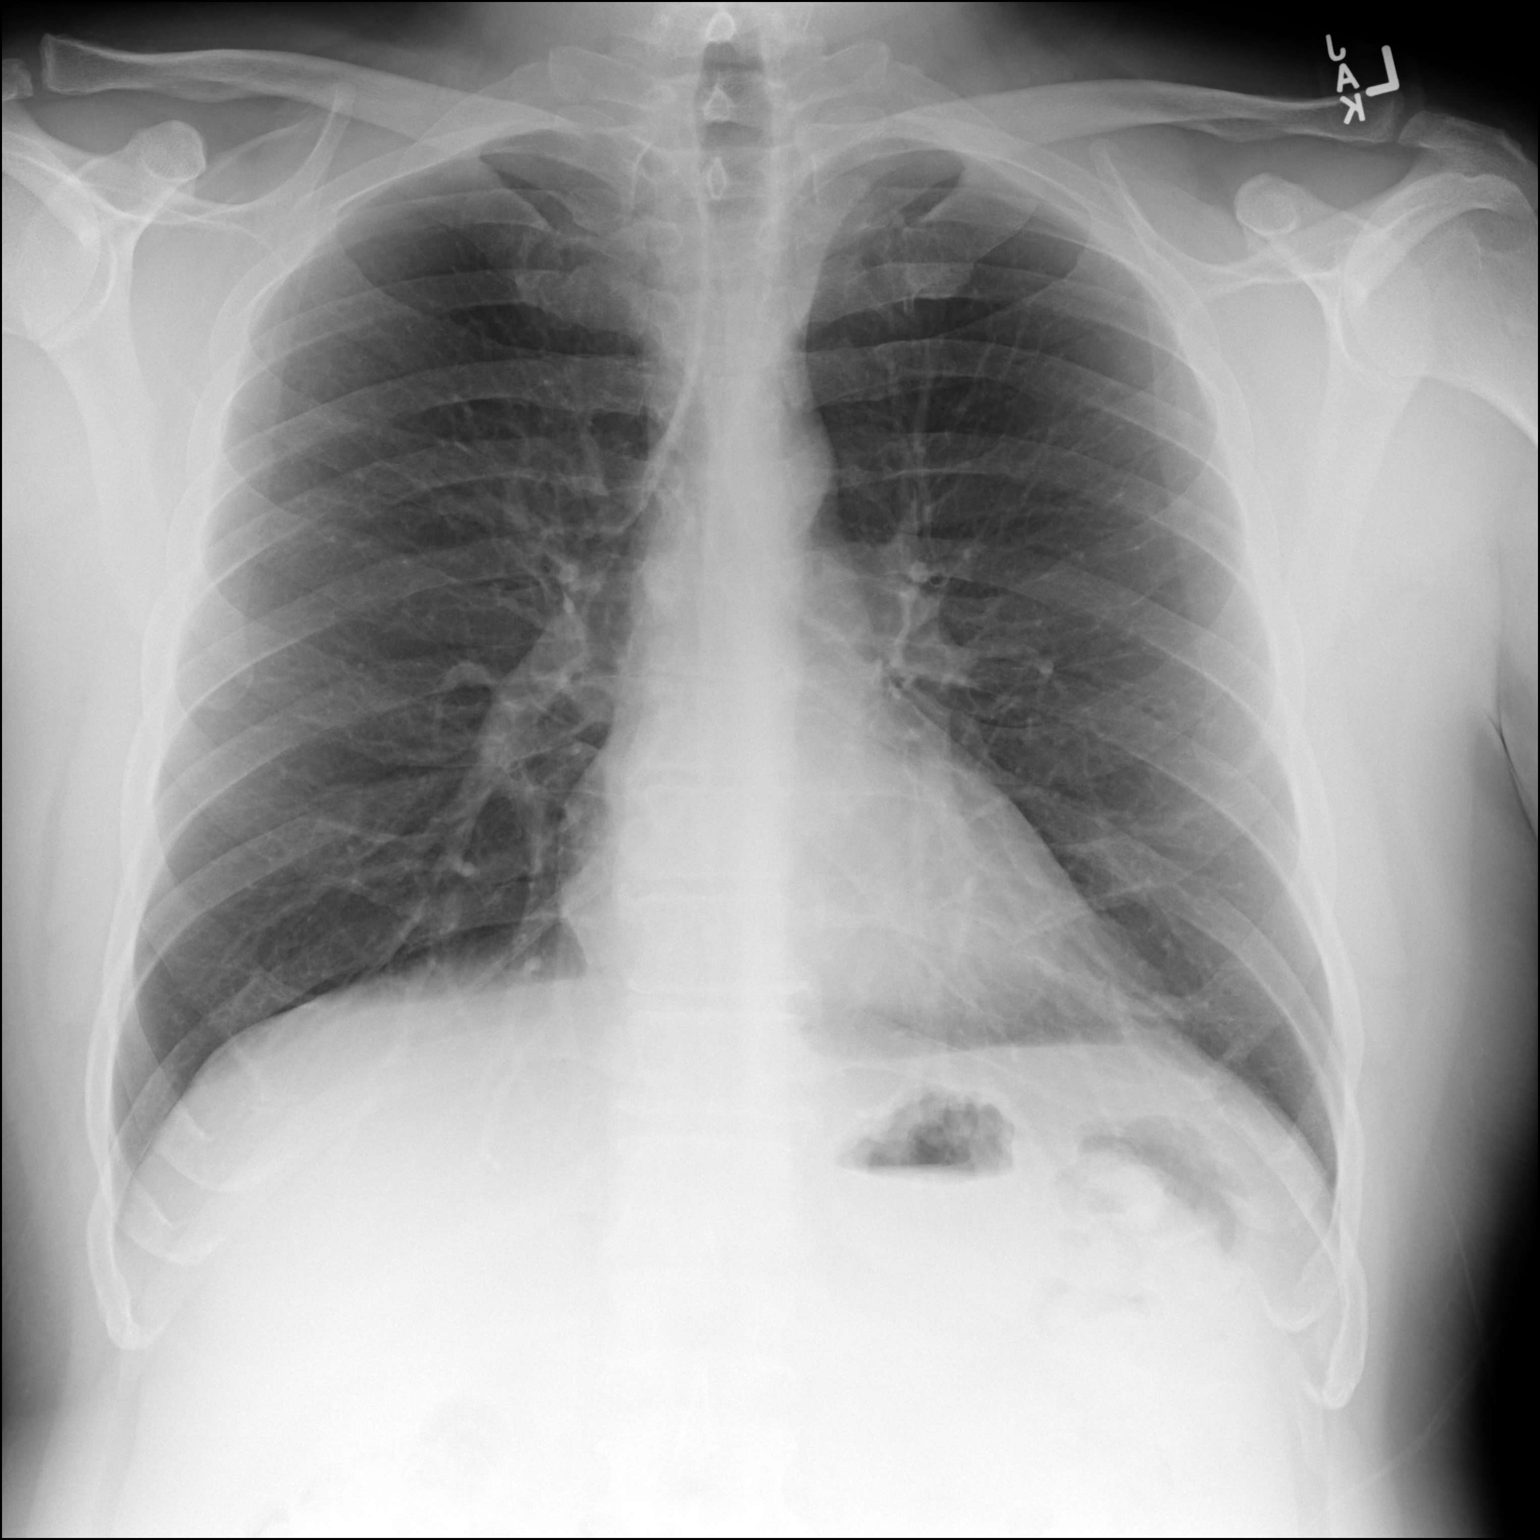

[dg chest 2 view (2 of 2)]
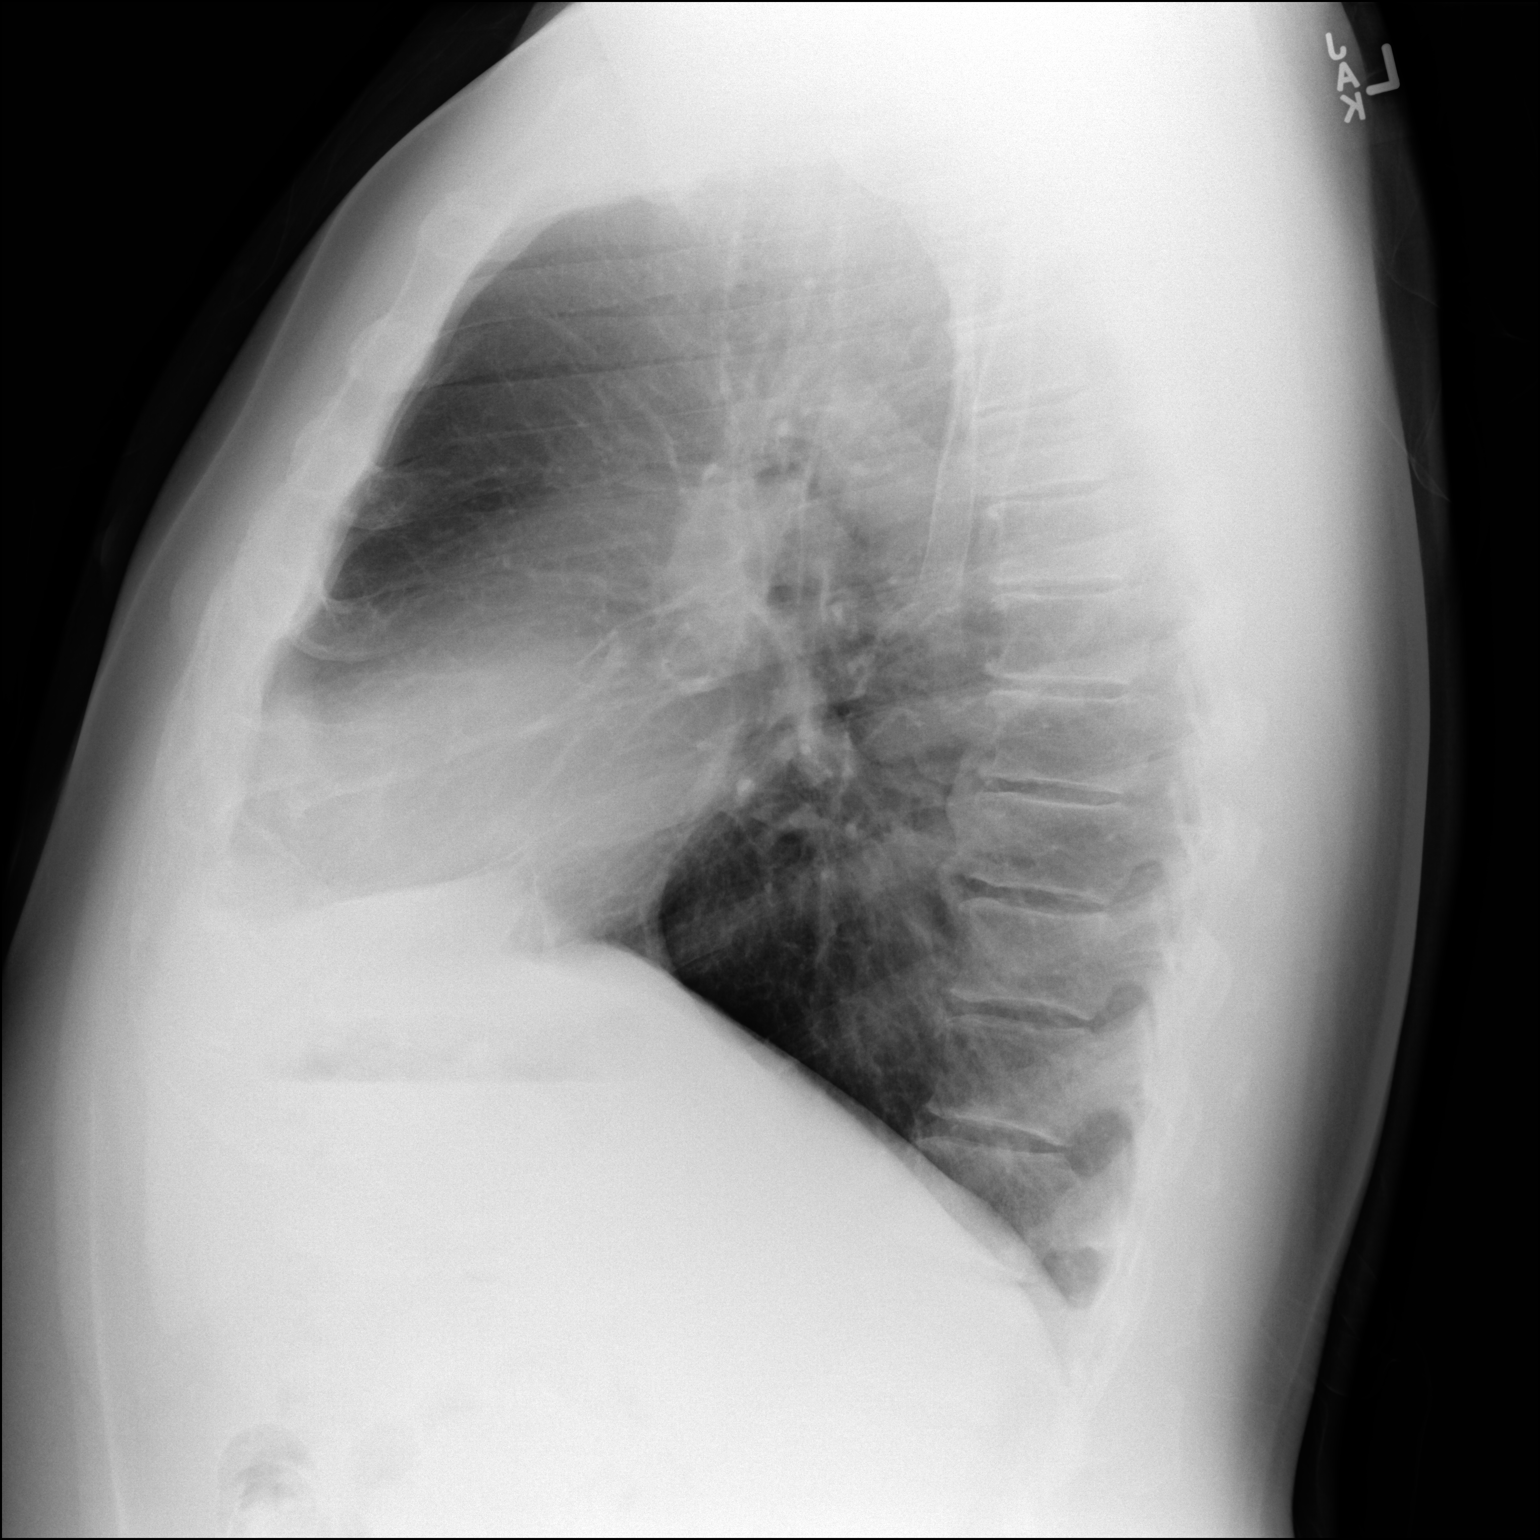

[2 of 2 positions shown; findings below may reference images not displayed]

FINDINGS: The heart size and mediastinal contours are within normal limits.
Both lungs are clear. The visualized skeletal structures are
unremarkable.
IMPRESSION: No active cardiopulmonary disease.

## 2020-11-05 DIAGNOSIS — Z20822 Contact with and (suspected) exposure to covid-19: Secondary | ICD-10-CM | POA: Diagnosis not present

## 2021-01-03 DIAGNOSIS — J40 Bronchitis, not specified as acute or chronic: Secondary | ICD-10-CM | POA: Diagnosis not present

## 2021-01-03 DIAGNOSIS — R051 Acute cough: Secondary | ICD-10-CM | POA: Diagnosis not present

## 2021-01-03 DIAGNOSIS — Z03818 Encounter for observation for suspected exposure to other biological agents ruled out: Secondary | ICD-10-CM | POA: Diagnosis not present

## 2021-01-25 DIAGNOSIS — N529 Male erectile dysfunction, unspecified: Secondary | ICD-10-CM | POA: Diagnosis not present

## 2021-01-25 DIAGNOSIS — Z13220 Encounter for screening for lipoid disorders: Secondary | ICD-10-CM | POA: Diagnosis not present

## 2021-01-25 DIAGNOSIS — Z0000 Encounter for general adult medical examination without abnormal findings: Secondary | ICD-10-CM | POA: Diagnosis not present

## 2021-01-25 DIAGNOSIS — K219 Gastro-esophageal reflux disease without esophagitis: Secondary | ICD-10-CM | POA: Diagnosis not present

## 2021-03-14 DIAGNOSIS — M67871 Other specified disorders of synovium, right ankle and foot: Secondary | ICD-10-CM | POA: Diagnosis not present

## 2021-03-14 DIAGNOSIS — M25571 Pain in right ankle and joints of right foot: Secondary | ICD-10-CM | POA: Diagnosis not present

## 2021-06-27 DIAGNOSIS — M26602 Left temporomandibular joint disorder, unspecified: Secondary | ICD-10-CM | POA: Diagnosis not present

## 2021-06-27 DIAGNOSIS — R079 Chest pain, unspecified: Secondary | ICD-10-CM | POA: Diagnosis not present

## 2021-06-27 DIAGNOSIS — R0789 Other chest pain: Secondary | ICD-10-CM | POA: Diagnosis not present

## 2021-11-13 DIAGNOSIS — J22 Unspecified acute lower respiratory infection: Secondary | ICD-10-CM | POA: Diagnosis not present

## 2021-11-13 DIAGNOSIS — R059 Cough, unspecified: Secondary | ICD-10-CM | POA: Diagnosis not present

## 2021-11-28 DIAGNOSIS — J988 Other specified respiratory disorders: Secondary | ICD-10-CM | POA: Diagnosis not present

## 2021-11-28 DIAGNOSIS — B9689 Other specified bacterial agents as the cause of diseases classified elsewhere: Secondary | ICD-10-CM | POA: Diagnosis not present

## 2021-11-28 DIAGNOSIS — J069 Acute upper respiratory infection, unspecified: Secondary | ICD-10-CM | POA: Diagnosis not present

## 2021-11-28 DIAGNOSIS — R062 Wheezing: Secondary | ICD-10-CM | POA: Diagnosis not present

## 2022-03-01 DIAGNOSIS — M7661 Achilles tendinitis, right leg: Secondary | ICD-10-CM | POA: Diagnosis not present

## 2022-03-01 DIAGNOSIS — M25571 Pain in right ankle and joints of right foot: Secondary | ICD-10-CM | POA: Diagnosis not present

## 2022-03-08 DIAGNOSIS — M25571 Pain in right ankle and joints of right foot: Secondary | ICD-10-CM | POA: Diagnosis not present

## 2022-03-08 DIAGNOSIS — Z0000 Encounter for general adult medical examination without abnormal findings: Secondary | ICD-10-CM | POA: Diagnosis not present

## 2022-03-08 DIAGNOSIS — E782 Mixed hyperlipidemia: Secondary | ICD-10-CM | POA: Diagnosis not present

## 2022-03-08 DIAGNOSIS — Z125 Encounter for screening for malignant neoplasm of prostate: Secondary | ICD-10-CM | POA: Diagnosis not present

## 2022-03-19 DIAGNOSIS — M67871 Other specified disorders of synovium, right ankle and foot: Secondary | ICD-10-CM | POA: Diagnosis not present

## 2022-03-29 DIAGNOSIS — M25571 Pain in right ankle and joints of right foot: Secondary | ICD-10-CM | POA: Diagnosis not present

## 2022-04-02 DIAGNOSIS — M25571 Pain in right ankle and joints of right foot: Secondary | ICD-10-CM | POA: Diagnosis not present

## 2022-04-03 DIAGNOSIS — M67871 Other specified disorders of synovium, right ankle and foot: Secondary | ICD-10-CM | POA: Diagnosis not present

## 2022-04-05 DIAGNOSIS — M25571 Pain in right ankle and joints of right foot: Secondary | ICD-10-CM | POA: Diagnosis not present

## 2022-04-19 DIAGNOSIS — E8889 Other specified metabolic disorders: Secondary | ICD-10-CM | POA: Diagnosis not present

## 2022-04-19 DIAGNOSIS — Z1331 Encounter for screening for depression: Secondary | ICD-10-CM | POA: Diagnosis not present

## 2022-04-19 DIAGNOSIS — Z6837 Body mass index (BMI) 37.0-37.9, adult: Secondary | ICD-10-CM | POA: Diagnosis not present

## 2022-04-19 DIAGNOSIS — E782 Mixed hyperlipidemia: Secondary | ICD-10-CM | POA: Diagnosis not present

## 2022-04-19 DIAGNOSIS — N529 Male erectile dysfunction, unspecified: Secondary | ICD-10-CM | POA: Diagnosis not present

## 2022-04-19 DIAGNOSIS — E538 Deficiency of other specified B group vitamins: Secondary | ICD-10-CM | POA: Diagnosis not present

## 2022-04-19 DIAGNOSIS — R200 Anesthesia of skin: Secondary | ICD-10-CM | POA: Diagnosis not present

## 2022-04-19 DIAGNOSIS — E559 Vitamin D deficiency, unspecified: Secondary | ICD-10-CM | POA: Diagnosis not present

## 2022-04-19 DIAGNOSIS — Z79899 Other long term (current) drug therapy: Secondary | ICD-10-CM | POA: Diagnosis not present

## 2022-04-19 DIAGNOSIS — K219 Gastro-esophageal reflux disease without esophagitis: Secondary | ICD-10-CM | POA: Diagnosis not present

## 2022-05-03 DIAGNOSIS — R200 Anesthesia of skin: Secondary | ICD-10-CM | POA: Diagnosis not present

## 2022-05-03 DIAGNOSIS — E559 Vitamin D deficiency, unspecified: Secondary | ICD-10-CM | POA: Diagnosis not present

## 2022-05-03 DIAGNOSIS — E538 Deficiency of other specified B group vitamins: Secondary | ICD-10-CM | POA: Diagnosis not present

## 2022-05-03 DIAGNOSIS — M779 Enthesopathy, unspecified: Secondary | ICD-10-CM | POA: Diagnosis not present

## 2022-05-28 DIAGNOSIS — E65 Localized adiposity: Secondary | ICD-10-CM | POA: Diagnosis not present

## 2022-05-28 DIAGNOSIS — E559 Vitamin D deficiency, unspecified: Secondary | ICD-10-CM | POA: Diagnosis not present

## 2022-05-28 DIAGNOSIS — R200 Anesthesia of skin: Secondary | ICD-10-CM | POA: Diagnosis not present

## 2022-05-28 DIAGNOSIS — E538 Deficiency of other specified B group vitamins: Secondary | ICD-10-CM | POA: Diagnosis not present

## 2022-07-17 DIAGNOSIS — Z1211 Encounter for screening for malignant neoplasm of colon: Secondary | ICD-10-CM | POA: Diagnosis not present

## 2022-07-17 DIAGNOSIS — K648 Other hemorrhoids: Secondary | ICD-10-CM | POA: Diagnosis not present

## 2022-07-25 DIAGNOSIS — E65 Localized adiposity: Secondary | ICD-10-CM | POA: Diagnosis not present

## 2022-07-25 DIAGNOSIS — Z9189 Other specified personal risk factors, not elsewhere classified: Secondary | ICD-10-CM | POA: Diagnosis not present

## 2022-07-25 DIAGNOSIS — Z0000 Encounter for general adult medical examination without abnormal findings: Secondary | ICD-10-CM | POA: Diagnosis not present

## 2022-07-25 DIAGNOSIS — R519 Headache, unspecified: Secondary | ICD-10-CM | POA: Diagnosis not present

## 2022-07-25 DIAGNOSIS — R200 Anesthesia of skin: Secondary | ICD-10-CM | POA: Diagnosis not present

## 2022-08-13 DIAGNOSIS — R519 Headache, unspecified: Secondary | ICD-10-CM | POA: Diagnosis not present

## 2022-08-13 DIAGNOSIS — Z6836 Body mass index (BMI) 36.0-36.9, adult: Secondary | ICD-10-CM | POA: Diagnosis not present

## 2022-09-04 DIAGNOSIS — E6609 Other obesity due to excess calories: Secondary | ICD-10-CM | POA: Diagnosis not present

## 2022-09-04 DIAGNOSIS — K219 Gastro-esophageal reflux disease without esophagitis: Secondary | ICD-10-CM | POA: Diagnosis not present

## 2022-09-04 DIAGNOSIS — R519 Headache, unspecified: Secondary | ICD-10-CM | POA: Diagnosis not present

## 2022-09-04 DIAGNOSIS — Z6835 Body mass index (BMI) 35.0-35.9, adult: Secondary | ICD-10-CM | POA: Diagnosis not present

## 2022-10-08 DIAGNOSIS — R519 Headache, unspecified: Secondary | ICD-10-CM | POA: Diagnosis not present

## 2022-10-08 DIAGNOSIS — E6609 Other obesity due to excess calories: Secondary | ICD-10-CM | POA: Diagnosis not present

## 2022-10-08 DIAGNOSIS — K219 Gastro-esophageal reflux disease without esophagitis: Secondary | ICD-10-CM | POA: Diagnosis not present

## 2022-11-14 DIAGNOSIS — K219 Gastro-esophageal reflux disease without esophagitis: Secondary | ICD-10-CM | POA: Diagnosis not present

## 2022-11-14 DIAGNOSIS — R519 Headache, unspecified: Secondary | ICD-10-CM | POA: Diagnosis not present

## 2022-12-10 DIAGNOSIS — R519 Headache, unspecified: Secondary | ICD-10-CM | POA: Diagnosis not present

## 2022-12-10 DIAGNOSIS — K219 Gastro-esophageal reflux disease without esophagitis: Secondary | ICD-10-CM | POA: Diagnosis not present

## 2022-12-10 DIAGNOSIS — M79671 Pain in right foot: Secondary | ICD-10-CM | POA: Diagnosis not present

## 2022-12-10 DIAGNOSIS — M25562 Pain in left knee: Secondary | ICD-10-CM | POA: Diagnosis not present

## 2023-01-14 DIAGNOSIS — M79671 Pain in right foot: Secondary | ICD-10-CM | POA: Diagnosis not present

## 2023-01-14 DIAGNOSIS — M25562 Pain in left knee: Secondary | ICD-10-CM | POA: Diagnosis not present

## 2023-01-14 DIAGNOSIS — Z9189 Other specified personal risk factors, not elsewhere classified: Secondary | ICD-10-CM | POA: Diagnosis not present

## 2023-01-14 DIAGNOSIS — K219 Gastro-esophageal reflux disease without esophagitis: Secondary | ICD-10-CM | POA: Diagnosis not present

## 2023-01-14 DIAGNOSIS — R519 Headache, unspecified: Secondary | ICD-10-CM | POA: Diagnosis not present

## 2023-01-23 DIAGNOSIS — M25562 Pain in left knee: Secondary | ICD-10-CM | POA: Diagnosis not present

## 2023-02-11 DIAGNOSIS — M79671 Pain in right foot: Secondary | ICD-10-CM | POA: Diagnosis not present

## 2023-02-11 DIAGNOSIS — Z9189 Other specified personal risk factors, not elsewhere classified: Secondary | ICD-10-CM | POA: Diagnosis not present

## 2023-02-11 DIAGNOSIS — R519 Headache, unspecified: Secondary | ICD-10-CM | POA: Diagnosis not present

## 2023-02-11 DIAGNOSIS — M25562 Pain in left knee: Secondary | ICD-10-CM | POA: Diagnosis not present

## 2023-02-11 DIAGNOSIS — K219 Gastro-esophageal reflux disease without esophagitis: Secondary | ICD-10-CM | POA: Diagnosis not present

## 2023-03-14 DIAGNOSIS — K219 Gastro-esophageal reflux disease without esophagitis: Secondary | ICD-10-CM | POA: Diagnosis not present

## 2023-03-14 DIAGNOSIS — Z9189 Other specified personal risk factors, not elsewhere classified: Secondary | ICD-10-CM | POA: Diagnosis not present

## 2023-03-14 DIAGNOSIS — G8929 Other chronic pain: Secondary | ICD-10-CM | POA: Diagnosis not present

## 2023-03-14 DIAGNOSIS — R519 Headache, unspecified: Secondary | ICD-10-CM | POA: Diagnosis not present

## 2023-03-15 DIAGNOSIS — Z0000 Encounter for general adult medical examination without abnormal findings: Secondary | ICD-10-CM | POA: Diagnosis not present

## 2023-03-15 DIAGNOSIS — Z125 Encounter for screening for malignant neoplasm of prostate: Secondary | ICD-10-CM | POA: Diagnosis not present

## 2023-03-15 DIAGNOSIS — E782 Mixed hyperlipidemia: Secondary | ICD-10-CM | POA: Diagnosis not present

## 2023-04-23 DIAGNOSIS — Z79899 Other long term (current) drug therapy: Secondary | ICD-10-CM | POA: Diagnosis not present

## 2023-04-23 DIAGNOSIS — E6609 Other obesity due to excess calories: Secondary | ICD-10-CM | POA: Diagnosis not present

## 2023-04-23 DIAGNOSIS — K219 Gastro-esophageal reflux disease without esophagitis: Secondary | ICD-10-CM | POA: Diagnosis not present

## 2023-04-23 DIAGNOSIS — G8929 Other chronic pain: Secondary | ICD-10-CM | POA: Diagnosis not present

## 2023-04-23 DIAGNOSIS — R519 Headache, unspecified: Secondary | ICD-10-CM | POA: Diagnosis not present

## 2023-04-23 DIAGNOSIS — Z6833 Body mass index (BMI) 33.0-33.9, adult: Secondary | ICD-10-CM | POA: Diagnosis not present

## 2023-08-26 DIAGNOSIS — Z8709 Personal history of other diseases of the respiratory system: Secondary | ICD-10-CM | POA: Diagnosis not present

## 2023-08-26 DIAGNOSIS — U071 COVID-19: Secondary | ICD-10-CM | POA: Diagnosis not present

## 2023-08-26 DIAGNOSIS — R051 Acute cough: Secondary | ICD-10-CM | POA: Diagnosis not present

## 2023-08-26 DIAGNOSIS — R509 Fever, unspecified: Secondary | ICD-10-CM | POA: Diagnosis not present

## 2023-12-30 ENCOUNTER — Other Ambulatory Visit: Payer: Self-pay | Admitting: Family Medicine

## 2023-12-30 ENCOUNTER — Ambulatory Visit
Admission: RE | Admit: 2023-12-30 | Discharge: 2023-12-30 | Disposition: A | Discharge status: Home / Self Care | Source: Ambulatory Visit | Attending: Family Medicine

## 2023-12-30 ENCOUNTER — Ambulatory Visit
Admission: RE | Admit: 2023-12-30 | Discharge: 2023-12-30 | Disposition: A | Discharge status: Home / Self Care | Source: Ambulatory Visit | Attending: Family Medicine | Admitting: Family Medicine

## 2023-12-30 DIAGNOSIS — R0600 Dyspnea, unspecified: Secondary | ICD-10-CM

## 2023-12-30 DIAGNOSIS — R600 Localized edema: Secondary | ICD-10-CM | POA: Diagnosis not present

## 2023-12-30 DIAGNOSIS — R079 Chest pain, unspecified: Secondary | ICD-10-CM | POA: Diagnosis not present

## 2023-12-30 MED ORDER — IOPAMIDOL (ISOVUE-370) INJECTION 76%
125.0000 mL | Freq: Once | INTRAVENOUS | Status: AC | PRN
  Administered 2023-12-30: 125 mL via INTRAVENOUS

## 2024-03-20 DIAGNOSIS — E7800 Pure hypercholesterolemia, unspecified: Secondary | ICD-10-CM | POA: Diagnosis not present

## 2024-03-20 DIAGNOSIS — R600 Localized edema: Secondary | ICD-10-CM | POA: Diagnosis not present

## 2024-03-20 DIAGNOSIS — Z6836 Body mass index (BMI) 36.0-36.9, adult: Secondary | ICD-10-CM | POA: Diagnosis not present

## 2024-03-20 DIAGNOSIS — Z0000 Encounter for general adult medical examination without abnormal findings: Secondary | ICD-10-CM | POA: Diagnosis not present

## 2024-03-20 DIAGNOSIS — K219 Gastro-esophageal reflux disease without esophagitis: Secondary | ICD-10-CM | POA: Diagnosis not present

## 2024-03-20 DIAGNOSIS — Z23 Encounter for immunization: Secondary | ICD-10-CM | POA: Diagnosis not present

## 2024-04-23 DIAGNOSIS — R946 Abnormal results of thyroid function studies: Secondary | ICD-10-CM | POA: Diagnosis not present

## 2024-06-08 DIAGNOSIS — I83893 Varicose veins of bilateral lower extremities with other complications: Secondary | ICD-10-CM | POA: Diagnosis not present

## 2024-06-08 DIAGNOSIS — M79661 Pain in right lower leg: Secondary | ICD-10-CM | POA: Diagnosis not present

## 2024-06-08 DIAGNOSIS — M79662 Pain in left lower leg: Secondary | ICD-10-CM | POA: Diagnosis not present

## 2024-06-08 DIAGNOSIS — M79604 Pain in right leg: Secondary | ICD-10-CM | POA: Diagnosis not present

## 2024-06-12 ENCOUNTER — Telehealth

## 2024-06-12 DIAGNOSIS — J069 Acute upper respiratory infection, unspecified: Secondary | ICD-10-CM | POA: Diagnosis not present

## 2024-06-13 MED ORDER — BENZONATATE 100 MG PO CAPS: 100.0000 mg | ORAL_CAPSULE | Freq: Three times a day (TID) | ORAL | 0 refills | Status: AC | PRN

## 2024-06-13 MED ORDER — LIDOCAINE VISCOUS HCL 2 % MT SOLN: 10.0000 mL | Freq: Four times a day (QID) | OROMUCOSAL | 0 refills | Status: AC | PRN

## 2024-06-13 NOTE — Progress Notes (Signed)
 E visit for Flu like symptoms   We are sorry that you are not feeling well.  Here is how we plan to help! Based on what you have shared with me it looks like you may have flu-like symptoms that should be watched but do not seem to indicate anti-viral treatment.  Influenza or the flu is  an infection caused by a respiratory virus. The flu virus is highly contagious and persons who did not receive their yearly flu vaccination may catch the flu from close contact.  We have anti-viral medications to treat the viruses that cause this infection. They are not a cure and only shorten the course of the infection. These prescriptions are most effective when they are given within the first 2 days of flu symptoms. Antiviral medications are indicated if you have a high risk of complications from the flu. You should  also consider an antiviral medication if you are in close contact with someone who is at risk. These medications can help patients avoid complications from the flu but have side effects that you should know.   Possible side effects from Tamiflu or oseltamivir include nausea, vomiting, diarrhea, dizziness, headaches, eye redness, sleep problems or other respiratory symptoms. You should not take Tamiflu if you have an allergy to oseltamivir or any to the ingredients in Tamiflu.  Based upon your symptoms I have prescribed Viscous lidocaine  for the sore throat.  Please follow pharmacy instructions.   For nasal congestion, you may use an oral decongestant such as Mucinex D or if you have glaucoma or high blood pressure use plain Mucinex.  Saline nasal spray or nasal drops can help and can safely be used as often as needed for congestion.  If you have a sore or scratchy throat, use a saltwater gargle-  to  teaspoon of salt dissolved in a 4-ounce to 8-ounce glass of warm water.  Gargle the solution for approximately 15-30 seconds and then spit.  It is important not to swallow the solution.  You can  also use throat lozenges/cough drops and Chloraseptic spray to help with throat pain or discomfort.  Warm or cold liquids can also be helpful in relieving throat pain.  For headache, pain or general discomfort, you can use Ibuprofen or Tylenol  as directed.   Some authorities believe that zinc sprays or the use of Echinacea may shorten the course of your symptoms.  I have prescribed the following medications to help lessen symptoms: I have prescribed Tessalon  Perles 100 mg. You may take 1-2 capsules every 8 hours as needed for cough  You are to isolate at home until you have been fever-free for at least 24 hours without a fever-reducing medication, and symptoms have been steadily improving for 24 hours.  If you must be around other household members who do not have symptoms, you need to make sure that both you and the family members are masking consistently with a high-quality mask.  If you note any worsening of symptoms despite treatment, please seek an in-person evaluation ASAP. If you note any significant shortness of breath or any chest pain, please seek ED evaluation. Please do not delay care!  ANYONE WHO HAS FLU SYMPTOMS SHOULD: Stay home. The flu is highly contagious and going out or to work exposes others! Be sure to drink plenty of fluids. Water is fine as well as fruit juices, sodas and electrolyte beverages. You may want to stay away from caffeine or alcohol. If you are nauseated, try taking small sips of  liquids. How do you know if you are getting enough fluid? Your urine should be a pale yellow or almost colorless. Get rest. Taking a steamy shower or using a humidifier may help nasal congestion and ease sore throat pain. Using a saline nasal spray works much the same way. Cough drops, hard candies and sore throat lozenges may ease your cough. Line up a caregiver. Have someone check on you regularly.  GET HELP RIGHT AWAY IF: You cannot keep down liquids or your medications. You  become short of breath Your fell like you are going to pass out or loose consciousness. Your symptoms persist after you have completed your treatment plan  MAKE SURE YOU  Understand these instructions. Will watch your condition. Will get help right away if you are not doing well or get worse.  Your e-visit answers were reviewed by a board certified advanced clinical practitioner to complete your personal care plan.  Depending on the condition, your plan could have included both over the counter or prescription medications.  If there is a problem please reply  once you have received a response from your provider.  Your safety is important to us .  If you have drug allergies check your prescription carefully.    You can use MyChart to ask questions about todays visit, request a non-urgent call back, or ask for a work or school excuse for 24 hours related to this e-Visit. If it has been greater than 24 hours you will need to follow up with your provider, or enter a new e-Visit to address those concerns.  You will get an e-mail in the next two days asking about your experience.  I hope that your e-visit has been valuable and will speed your recovery. Thank you for using e-visits.   I have spent 5 minutes in review of e-visit questionnaire, review and updating patient chart, medical decision making and response to patient.   Roosvelt Mater, PA-C
